# Patient Record
Sex: Female | Born: 1970 | Race: Black or African American | Hispanic: No | Marital: Single | Smoking: Former smoker
Health system: Southern US, Community
[De-identification: ages and names within clinical notes are randomized; demographics above are authoritative.]

## PROBLEM LIST (undated history)

## (undated) DIAGNOSIS — E119 Type 2 diabetes mellitus without complications: Secondary | ICD-10-CM

## (undated) DIAGNOSIS — K219 Gastro-esophageal reflux disease without esophagitis: Secondary | ICD-10-CM

## (undated) DIAGNOSIS — K859 Acute pancreatitis without necrosis or infection, unspecified: Secondary | ICD-10-CM

## (undated) DIAGNOSIS — E785 Hyperlipidemia, unspecified: Secondary | ICD-10-CM

## (undated) DIAGNOSIS — I10 Essential (primary) hypertension: Secondary | ICD-10-CM

## (undated) HISTORY — PX: TUBAL LIGATION: SHX77

## (undated) HISTORY — PX: CHOLECYSTECTOMY: SHX55

---

## 2004-10-03 ENCOUNTER — Other Ambulatory Visit: Payer: Self-pay

## 2004-10-03 ENCOUNTER — Emergency Department: Payer: Self-pay | Admitting: Emergency Medicine

## 2004-11-13 ENCOUNTER — Emergency Department: Payer: Self-pay | Admitting: Emergency Medicine

## 2005-04-24 ENCOUNTER — Emergency Department: Payer: Self-pay | Admitting: Emergency Medicine

## 2006-09-14 ENCOUNTER — Emergency Department: Payer: Self-pay | Admitting: Emergency Medicine

## 2006-11-14 ENCOUNTER — Emergency Department: Payer: Self-pay | Admitting: Emergency Medicine

## 2007-11-11 ENCOUNTER — Emergency Department: Payer: Self-pay | Admitting: Unknown Physician Specialty

## 2010-01-07 ENCOUNTER — Emergency Department: Payer: Self-pay | Admitting: Unknown Physician Specialty

## 2010-03-30 ENCOUNTER — Ambulatory Visit: Payer: Self-pay | Admitting: Internal Medicine

## 2010-04-01 ENCOUNTER — Ambulatory Visit: Payer: Self-pay | Admitting: Internal Medicine

## 2011-03-25 ENCOUNTER — Emergency Department: Payer: Self-pay | Admitting: Emergency Medicine

## 2012-07-21 ENCOUNTER — Ambulatory Visit: Payer: Self-pay | Admitting: Family Medicine

## 2012-09-13 ENCOUNTER — Ambulatory Visit: Payer: Self-pay | Admitting: Emergency Medicine

## 2012-09-13 LAB — RAPID STREP-A WITH REFLX: Micro Text Report: NEGATIVE

## 2012-09-15 ENCOUNTER — Ambulatory Visit: Payer: Self-pay | Admitting: Emergency Medicine

## 2012-09-18 LAB — BETA STREP CULTURE(ARMC)

## 2013-06-07 ENCOUNTER — Ambulatory Visit: Payer: Self-pay | Admitting: Family Medicine

## 2013-12-09 ENCOUNTER — Emergency Department: Payer: Self-pay | Admitting: Emergency Medicine

## 2015-07-13 ENCOUNTER — Emergency Department
Admission: EM | Admit: 2015-07-13 | Discharge: 2015-07-13 | Disposition: A | Discharge status: Home / Self Care | Payer: 59 | Attending: Emergency Medicine | Admitting: Emergency Medicine

## 2015-07-13 ENCOUNTER — Encounter: Payer: Self-pay | Admitting: Emergency Medicine

## 2015-07-13 DIAGNOSIS — R1084 Generalized abdominal pain: Secondary | ICD-10-CM | POA: Diagnosis present

## 2015-07-13 DIAGNOSIS — A0811 Acute gastroenteropathy due to Norwalk agent: Secondary | ICD-10-CM | POA: Diagnosis not present

## 2015-07-13 DIAGNOSIS — I10 Essential (primary) hypertension: Secondary | ICD-10-CM | POA: Diagnosis not present

## 2015-07-13 DIAGNOSIS — E119 Type 2 diabetes mellitus without complications: Secondary | ICD-10-CM | POA: Diagnosis not present

## 2015-07-13 HISTORY — DX: Essential (primary) hypertension: I10

## 2015-07-13 HISTORY — DX: Type 2 diabetes mellitus without complications: E11.9

## 2015-07-13 LAB — CBC
HCT: 33.7 % — ABNORMAL LOW (ref 35.0–47.0)
HEMOGLOBIN: 11.3 g/dL — AB (ref 12.0–16.0)
MCH: 25.9 pg — ABNORMAL LOW (ref 26.0–34.0)
MCHC: 33.6 g/dL (ref 32.0–36.0)
MCV: 76.9 fL — ABNORMAL LOW (ref 80.0–100.0)
Platelets: 290 10*3/uL (ref 150–440)
RBC: 4.38 MIL/uL (ref 3.80–5.20)
RDW: 14.9 % — ABNORMAL HIGH (ref 11.5–14.5)
WBC: 7.2 10*3/uL (ref 3.6–11.0)

## 2015-07-13 LAB — COMPREHENSIVE METABOLIC PANEL
ALK PHOS: 63 U/L (ref 38–126)
ALT: 12 U/L — ABNORMAL LOW (ref 14–54)
ANION GAP: 5 (ref 5–15)
AST: 14 U/L — ABNORMAL LOW (ref 15–41)
Albumin: 3.5 g/dL (ref 3.5–5.0)
BILIRUBIN TOTAL: 0.4 mg/dL (ref 0.3–1.2)
BUN: 12 mg/dL (ref 6–20)
CALCIUM: 8.7 mg/dL — AB (ref 8.9–10.3)
CO2: 26 mmol/L (ref 22–32)
Chloride: 102 mmol/L (ref 101–111)
Creatinine, Ser: 0.83 mg/dL (ref 0.44–1.00)
GFR calc non Af Amer: >60 mL/min (ref >60)
Glucose, Bld: 181 mg/dL — ABNORMAL HIGH (ref 65–99)
Potassium: 3.7 mmol/L (ref 3.5–5.1)
SODIUM: 133 mmol/L — AB (ref 135–145)
TOTAL PROTEIN: 8.2 g/dL — AB (ref 6.5–8.1)

## 2015-07-13 LAB — POCT PREGNANCY, URINE: PREG TEST UR: NEGATIVE

## 2015-07-13 LAB — URINALYSIS COMPLETE WITH MICROSCOPIC (ARMC ONLY)
Bacteria, UA: NONE SEEN
Bilirubin Urine: NEGATIVE
Glucose, UA: NEGATIVE mg/dL
Ketones, ur: NEGATIVE mg/dL
LEUKOCYTES UA: NEGATIVE
NITRITE: NEGATIVE
PROTEIN: NEGATIVE mg/dL
SPECIFIC GRAVITY, URINE: 1.012 (ref 1.005–1.030)
pH: 5 (ref 5.0–8.0)

## 2015-07-13 LAB — LIPASE, BLOOD: Lipase: 25 U/L (ref 11–51)

## 2015-07-13 MED ORDER — DICYCLOMINE HCL 20 MG PO TABS: 20.0000 mg | ORAL_TABLET | Freq: Three times a day (TID) | ORAL | Status: DC | PRN

## 2015-07-13 MED ORDER — ONDANSETRON HCL 4 MG PO TABS: 4.0000 mg | ORAL_TABLET | Freq: Every day | ORAL | Status: DC | PRN

## 2015-07-13 MED ORDER — ONDANSETRON 4 MG PO TBDP
4.0000 mg | ORAL_TABLET | Freq: Once | ORAL | Status: AC
  Administered 2015-07-13: 4 mg via ORAL
  Filled 2015-07-13: qty 1

## 2015-07-13 MED ORDER — FAMOTIDINE 20 MG PO TABS: 20.0000 mg | ORAL_TABLET | Freq: Two times a day (BID) | ORAL | Status: DC

## 2015-07-13 MED ORDER — OXYCODONE-ACETAMINOPHEN 5-325 MG PO TABS
2.0000 | ORAL_TABLET | Freq: Once | ORAL | Status: AC
  Administered 2015-07-13: 2 via ORAL
  Filled 2015-07-13: qty 2

## 2015-07-13 MED ORDER — LOPERAMIDE HCL 2 MG PO CAPS
4.0000 mg | ORAL_CAPSULE | Freq: Once | ORAL | Status: AC
  Administered 2015-07-13: 4 mg via ORAL
  Filled 2015-07-13: qty 2

## 2015-07-13 NOTE — Discharge Instructions (Signed)

## 2015-07-13 NOTE — ED Provider Notes (Signed)
[facility] Emergency Department Provider Note     Time seen: ----------------------------------------- 1:12 PM on 07/13/2015 -----------------------------------------    I have reviewed the triage vital signs and the nursing notes.   HISTORY  Chief Complaint Abdominal Pain    HPI Shelly Benjamin is a 45 y.o. female who presents to ER for generalized abdominal pain with nausea vomiting and diarrhea for the past 4 days. Patient states he last vomited Wednesday morning and has had diarrhea this morning. Patient states she's had some chronic abdominal pain that the etiology for was not discovered. She has had an endoscopy for this which was negative. Patient states she had been on Protonix. Aching at because she thought it was making her worse. Patient thinks it might be GERD related.   Past Medical History  Diagnosis Date  . Diabetes mellitus without complication (HCC)   . Hypertension     There are no active problems to display for this patient.   History reviewed. No pertinent past surgical history.  Allergies Morphine and related and Naproxen  Social History Social History  Substance Use Topics  . Smoking status: Never Smoker   . Smokeless tobacco: None  . Alcohol Use: No    Review of Systems Constitutional: Negative for fever. Eyes: Negative for visual changes. ENT: Negative for sore throat. Cardiovascular: Negative for chest pain. Respiratory: Negative for shortness of breath. Gastrointestinal: Positive for abdominal pain, vomiting and diarrhea Genitourinary: Negative for dysuria. Musculoskeletal: Negative for back pain. Skin: Negative for rash. Neurological: Negative for headaches, focal weakness or numbness.  10-point ROS otherwise negative.  ____________________________________________   PHYSICAL EXAM:  VITAL SIGNS: ED Triage Vitals  Enc Vitals Group     BP 07/13/15 1114 139/88 mmHg     Pulse Rate 07/13/15 1114 84    Resp 07/13/15 1114 20     Temp 07/13/15 1114 98.2 F (36.8 C)     Temp Source 07/13/15 1114 Oral     SpO2 07/13/15 1114 100 %     Weight 07/13/15 1114 291 lb (131.997 kg)     Height 07/13/15 1114 5\' 6"  (1.676 m)     Head Cir --      Peak Flow --      Pain Score 07/13/15 1111 8     Pain Loc --      Pain Edu? --      Excl. in GC? --    Constitutional: Alert and oriented. Well appearing and in no distress. Eyes: Conjunctivae are normal. PERRL. Normal extraocular movements. ENT   Head: Normocephalic and atraumatic.   Nose: No congestion/rhinnorhea.   Mouth/Throat: Mucous membranes are moist.   Neck: No stridor. Cardiovascular: Normal rate, regular rhythm. Normal and symmetric distal pulses are present in all extremities. No murmurs, rubs, or gallops. Respiratory: Normal respiratory effort without tachypnea nor retractions. Breath sounds are clear and equal bilaterally. No wheezes/rales/rhonchi. Gastrointestinal: Nonfocal tenderness, no rebound or guarding. Normal bowel sounds. Musculoskeletal: Nontender with normal range of motion in all extremities. No joint effusions.  No lower extremity tenderness nor edema. Neurologic:  Normal speech and language. No gross focal neurologic deficits are appreciated. Speech is normal. No gait instability. Skin:  Skin is warm, dry and intact. No rash noted. Psychiatric: Mood and affect are normal. Speech and behavior are normal. Patient exhibits appropriate insight and judgment. ____________________________________________  ED COURSE:  Pertinent labs & imaging results that were available during my care of the patient were reviewed by me and considered in  my medical decision making (see chart for details). Patient is no acute distress, will check abdominal labs, give antiemetics and antidiarrheal agents. ____________________________________________    LABS (pertinent positives/negatives)  Labs Reviewed  COMPREHENSIVE METABOLIC PANEL -  Abnormal; Notable for the following:    Sodium 133 (*)    Glucose, Bld 181 (*)    Calcium 8.7 (*)    Total Protein 8.2 (*)    AST 14 (*)    ALT 12 (*)    All other components within normal limits  CBC - Abnormal; Notable for the following:    Hemoglobin 11.3 (*)    HCT 33.7 (*)    MCV 76.9 (*)    MCH 25.9 (*)    RDW 14.9 (*)    All other components within normal limits  LIPASE, BLOOD  URINALYSIS COMPLETEWITH MICROSCOPIC ([facility] ONLY)  POC URINE PREG, ED  POCT PREGNANCY, URINE  ____________________________________________  FINAL ASSESSMENT AND PLAN  Norovirus  Plan: Patient with labs as dictated above. Patient looks well, labs are unremarkable with a benign exam. She'll be given antiemetics with Bentyl and is encouraged to restart an acids. She is stable for outpatient follow-up.   Emily Filbert, MD   Emily Filbert, MD 07/13/15 [phone]

## 2015-07-13 NOTE — ED Notes (Signed)
Developed body aches with abd pain and n/v/d for the past 4 days ..last time vomited was weds am  Last diarrhea times 4 this am

## 2015-07-13 NOTE — ED Notes (Signed)
UA Preg = Neg 

## 2015-09-29 ENCOUNTER — Ambulatory Visit
Admission: EM | Admit: 2015-09-29 | Discharge: 2015-09-29 | Disposition: A | Discharge status: Home / Self Care | Payer: 59 | Attending: Family Medicine | Admitting: Family Medicine

## 2015-09-29 DIAGNOSIS — A499 Bacterial infection, unspecified: Secondary | ICD-10-CM

## 2015-09-29 DIAGNOSIS — B3731 Acute candidiasis of vulva and vagina: Secondary | ICD-10-CM

## 2015-09-29 DIAGNOSIS — B3749 Other urogenital candidiasis: Secondary | ICD-10-CM | POA: Diagnosis not present

## 2015-09-29 DIAGNOSIS — N760 Acute vaginitis: Secondary | ICD-10-CM | POA: Diagnosis not present

## 2015-09-29 DIAGNOSIS — B373 Candidiasis of vulva and vagina: Secondary | ICD-10-CM | POA: Diagnosis not present

## 2015-09-29 DIAGNOSIS — B9689 Other specified bacterial agents as the cause of diseases classified elsewhere: Secondary | ICD-10-CM

## 2015-09-29 HISTORY — DX: Gastro-esophageal reflux disease without esophagitis: K21.9

## 2015-09-29 LAB — URINALYSIS COMPLETE WITH MICROSCOPIC (ARMC ONLY)
Bilirubin Urine: NEGATIVE
GLUCOSE, UA: NEGATIVE mg/dL
KETONES UR: NEGATIVE mg/dL
NITRITE: NEGATIVE
PROTEIN: NEGATIVE mg/dL
SPECIFIC GRAVITY, URINE: 1.015 (ref 1.005–1.030)
pH: 6.5 (ref 5.0–8.0)

## 2015-09-29 LAB — GLUCOSE, CAPILLARY: GLUCOSE-CAPILLARY: 160 mg/dL — AB (ref 65–99)

## 2015-09-29 LAB — WET PREP, GENITAL
SPERM: NONE SEEN
TRICH WET PREP: NONE SEEN

## 2015-09-29 LAB — CHLAMYDIA/NGC RT PCR (ARMC ONLY)
Chlamydia Tr: NOT DETECTED
N gonorrhoeae: NOT DETECTED

## 2015-09-29 MED ORDER — METRONIDAZOLE 500 MG PO TABS: 500.0000 mg | ORAL_TABLET | Freq: Two times a day (BID) | ORAL | Status: DC

## 2015-09-29 MED ORDER — TERCONAZOLE 80 MG VA SUPP: 80.0000 mg | Freq: Every day | VAGINAL | Status: DC

## 2015-09-29 MED ORDER — FLUCONAZOLE 150 MG PO TABS: 150.0000 mg | ORAL_TABLET | Freq: Once | ORAL | Status: DC

## 2015-09-29 NOTE — Discharge Instructions (Signed)
Bacterial Vaginosis °Bacterial vaginosis is an infection of the vagina. It happens when too many germs (bacteria) grow in the vagina. Having this infection puts you at risk for getting other infections from sex. Treating this infection can help lower your risk for other infections, such as:  °· Chlamydia. °· Gonorrhea. °· HIV. °· Herpes. °HOME CARE °· Take your medicine as told by your doctor. °· Finish your medicine even if you start to feel better. °· Tell your sex partner that you have an infection. They should see their doctor for treatment. °· During treatment: °· Avoid sex or use condoms correctly. °· Do not douche. °· Do not drink alcohol unless your doctor tells you it is ok. °· Do not breastfeed unless your doctor tells you it is ok. °GET HELP IF: °· You are not getting better after 3 days of treatment. °· You have more grey fluid (discharge) coming from your vagina than before. °· You have more pain than before. °· You have a fever. °MAKE SURE YOU:  °· Understand these instructions. °· Will watch your condition. °· Will get help right away if you are not doing well or get worse. °  °This information is not intended to replace advice given to you by your health care provider. Make sure you discuss any questions you have with your health care provider. °  °Document Released: 01/22/2008 Document Revised: 05/05/2014 Document Reviewed: 11/24/2012 °Elsevier Interactive Patient Education ©2016 [employer] ° °Monilial Vaginitis °Vaginitis in a soreness, swelling and redness (inflammation) of the vagina and vulva. Monilial vaginitis is not a sexually transmitted infection. °CAUSES  °Yeast vaginitis is caused by yeast (candida) that is normally found in your vagina. With a yeast infection, the candida has overgrown in number to a point that upsets the chemical balance. °SYMPTOMS  °· White, thick vaginal discharge. °· Swelling, itching, redness and irritation of the vagina and possibly the lips of the vagina  (vulva). °· Burning or painful urination. °· Painful intercourse. °DIAGNOSIS  °Things that may contribute to monilial vaginitis are: °· Postmenopausal and virginal states. °· Pregnancy. °· Infections. °· Being tired, sick or stressed, especially if you had monilial vaginitis in the past. °· Diabetes. Good control will help lower the chance. °· Birth control pills. °· Tight fitting garments. °· Using bubble bath, feminine sprays, douches or deodorant tampons. °· Taking certain medications that kill germs (antibiotics). °· Sporadic recurrence can occur if you become ill. °TREATMENT  °Your caregiver will give you medication. °· There are several kinds of anti monilial vaginal creams and suppositories specific for monilial vaginitis. For recurrent yeast infections, use a suppository or cream in the vagina 2 times a week, or as directed. °· Anti-monilial or steroid cream for the itching or irritation of the vulva may also be used. Get your caregiver's permission. °· Painting the vagina with methylene blue solution may help if the monilial cream does not work. °· Eating yogurt may help prevent monilial vaginitis. °HOME CARE INSTRUCTIONS  °· Finish all medication as prescribed. °· Do not have sex until treatment is completed or after your caregiver tells you it is okay. °· Take warm sitz baths. °· Do not douche. °· Do not use tampons, especially scented ones. °· Wear cotton underwear. °· Avoid tight pants and panty hose. °· Tell your sexual partner that you have a yeast infection. They should go to their caregiver if they have symptoms such as mild rash or itching. °· Your sexual partner should be treated as well   if your infection is difficult to eliminate. °· Practice safer sex. Use condoms. °· Some vaginal medications cause latex condoms to fail. Vaginal medications that harm condoms are: °¨ Cleocin cream. °¨ Butoconazole (Femstat®). °¨ Terconazole (Terazol®) vaginal suppository. °¨ Miconazole (Monistat®) (may be  purchased over the counter). °SEEK MEDICAL CARE IF:  °· You have a temperature by mouth above 102° F (38.9° C). °· The infection is getting worse after 2 days of treatment. °· The infection is not getting better after 3 days of treatment. °· You develop blisters in or around your vagina. °· You develop vaginal bleeding, and it is not your menstrual period. °· You have pain when you urinate. °· You develop intestinal problems. °· You have pain with sexual intercourse. °  °This information is not intended to replace advice given to you by your health care provider. Make sure you discuss any questions you have with your health care provider. °  °Document Released: 01/22/2005 Document Revised: 07/07/2011 Document Reviewed: 10/16/2014 °Elsevier Interactive Patient Education ©2016 [employer] ° °Vaginitis °Vaginitis is an inflammation of the vagina. It can happen when the normal bacteria and yeast in the vagina grow too much. There are different types. Treatment will depend on the type you have. °HOME CARE °· Take all medicines as told by your doctor. °· Keep your vagina area clean and dry. Avoid soap. Rinse the area with water. °· Avoid washing and cleaning out the vagina (douching). °· Do not use tampons or have sex (intercourse) until your treatment is done. °· Wipe from front to back after going to the restroom. °· Wear cotton underwear. °· Avoid wearing underwear while you sleep until your vaginitis is gone. °· Avoid tight pants. Avoid underwear or nylons without a cotton panel. °· Take off wet clothing (such as a bathing suit) as soon as you can. °· Use mild, unscented products. Avoid fabric softeners and scented: °¨ Feminine sprays. °¨ Laundry detergents. °¨ Tampons. °¨ Soaps or bubble baths. °· Practice safe sex and use condoms. °GET HELP RIGHT AWAY IF:  °· You have belly (abdominal) pain. °· You have a fever or lasting symptoms for more than 2-3 days. °· You have a fever and your symptoms suddenly get  worse. °MAKE SURE YOU:  °· Understand these instructions. °· Will watch this condition. °· Will get help right away if you are not doing well or get worse. °  °This information is not intended to replace advice given to you by your health care provider. Make sure you discuss any questions you have with your health care provider. °  °Document Released: 07/11/2008 Document Revised: 01/07/2012 Document Reviewed: 09/25/2011 °Elsevier Interactive Patient Education ©2016 [employer]vier [employer]. ° °

## 2015-09-29 NOTE — ED Provider Notes (Signed)
CSN: [id number]     Arrival date & time 09/29/15  4098 History   First MD Initiated Contact with Patient 09/29/15 [phone]    Nurses notes were reviewed. Chief Complaint  Patient presents with  . Vaginal Discharge    Pt reports protected sex with a new partner last Sat. (condom). Has hx of genital herpes. Denies current outbreak. On Tuesday started with vaginal itching then "clumpy grey drainage". Burning with urination started today. Pain 3/10   Patient with a small complex history. She reports a history of genital herpes about 8 or 9 years ago. She's not on suppressive therapy and has not had another outbreak since the first episode. She reports having a new partner she was at the beach this weekend and had sexual relations with a new partner but states that the condom was used at all times. During this time which was at the beach she also became dehydrated while getting IV fluids and she's had trouble complications were for diabetes she's increased her insulin NovoLog continues to 12 units twice a day but has also no significant decrease in appetite as well but she feels overall better. She reports on Tuesday she started having some vaginal irritation and that has progressively gotten worse the discharge greatest discharge and burning with urination today and some discomfort that she opted not to going to work today. She's not on suppressive therapy for her herpes infection.   She has a history of diabetes hypertension and GERD she's had a cholecystectomy and a tubal ligation before in the past. She unfortunately does smoke. She is allergic to morphine and Naprosyn  Diabetes and hypertension was the family but of course nothing is pertinent to today's visit.    (Consider location/radiation/quality/duration/timing/severity/associated sxs/prior Treatment) Patient is a 44 y.o. female presenting with vaginal discharge. The history is provided by the patient. No language interpreter was used.  Vaginal  Discharge Quality:  Wallace Cullens Severity:  Moderate Duration:  5 days Timing:  Constant Progression:  Worsening Chronicity:  New Context: during urination and spontaneously   Context: not recent antibiotic use   Relieved by:  Nothing Ineffective treatments:  None tried Associated symptoms: dysuria, urinary frequency and vaginal itching   Risk factors: new sexual partner   Risk factors: no unprotected sex     Past Medical History  Diagnosis Date  . Diabetes mellitus without complication (HCC)   . Hypertension   . GERD (gastroesophageal reflux disease)    Past Surgical History  Procedure Laterality Date  . Cholecystectomy    . Tubal ligation     History reviewed. No pertinent family history. Social History  Substance Use Topics  . Smoking status: Current Every Day Smoker -- 0.50 packs/day    Types: Cigarettes  . Smokeless tobacco: None  . Alcohol Use: No   OB History    No data available     Review of Systems  Genitourinary: Positive for dysuria and vaginal discharge.  All other systems reviewed and are negative.   Allergies  Morphine and related and Naproxen  Home Medications   Prior to Admission medications   Medication Sig Start Date End Date Taking? Authorizing Provider  amLODipine (NORVASC) 10 MG tablet Take 10 mg by mouth daily.   Yes Historical Provider, MD  famotidine (PEPCID) 20 MG tablet Take 1 tablet (20 mg total) by mouth 2 (two) times daily. 07/13/15  Yes Emily Filbert, MD  hydrochlorothiazide (HYDRODIURIL) 25 MG tablet Take 25 mg by mouth daily.   Yes  Historical Provider, MD  insulin NPH Human (HUMULIN N,NOVOLIN N) 100 UNIT/ML injection Inject 12 Units into the skin 2 (two) times daily before a meal.   Yes Historical Provider, MD  lisinopril (PRINIVIL,ZESTRIL) 10 MG tablet Take 10 mg by mouth daily.   Yes Historical Provider, MD  ondansetron (ZOFRAN) 4 MG tablet Take 1 tablet (4 mg total) by mouth daily as needed for nausea or vomiting. 07/13/15  Yes  Emily Filbert, MD  dicyclomine (BENTYL) 20 MG tablet Take 1 tablet (20 mg total) by mouth 3 (three) times daily as needed for spasms. 07/13/15 07/12/16  Emily Filbert, MD   Meds Ordered and Administered this Visit  Medications - No data to display  BP 138/81 mmHg  Pulse 84  Temp(Src) 98 F (36.7 C) (Oral)  Resp 20  Ht 5\' 8"  (1.727 m)  Wt 288 lb 5 oz (130.778 kg)  BMI 43.85 kg/m2  SpO2 100%  LMP 09/13/2015 No data found.   Physical Exam  Constitutional: She is oriented to person, place, and time. She appears well-developed and well-nourished.  HENT:  Head: Normocephalic and atraumatic.  Eyes: Pupils are equal, round, and reactive to light.  Genitourinary: Uterus normal. Rectal exam shows no external hemorrhoid, no internal hemorrhoid, no fissure and no tenderness.    There is lesion on the right labia. Cervix exhibits discharge. Right adnexum displays no tenderness and no fullness. Left adnexum displays no tenderness and no fullness. There is erythema in the vagina. Vaginal discharge found.  Musculoskeletal: Normal range of motion. She exhibits no edema.  Neurological: She is alert and oriented to person, place, and time.  Skin: Skin is warm and dry.  Psychiatric: She has a normal mood and affect.  Vitals reviewed.   ED Course  Procedures (including critical care time)  Labs Review Labs Reviewed  WET PREP, GENITAL - Abnormal; Notable for the following:    Yeast Wet Prep HPF POC PRESENT (*)    Clue Cells Wet Prep HPF POC PRESENT (*)    WBC, Wet Prep HPF POC MODERATE (*)    All other components within normal limits  URINALYSIS COMPLETEWITH MICROSCOPIC ([facility] ONLY) - Abnormal; Notable for the following:    Color, Urine STRAW (*)    APPearance HAZY (*)    Hgb urine dipstick 2+ (*)    Leukocytes, UA 2+ (*)    Bacteria, UA FEW (*)    Squamous Epithelial / LPF 6-30 (*)    All other components within normal limits  CHLAMYDIA/NGC RT PCR ([facility] ONLY)  HSV  CULTURE AND TYPING  URINE CULTURE  RPR  HIV ANTIBODY (ROUTINE TESTING)  CBG MONITORING, ED    Imaging Review No results found.   Visual Acuity Review  Right Eye Distance:   Left Eye Distance:   Bilateral Distance:    Right Eye Near:   Left Eye Near:    Bilateral Near:        Results for orders placed or performed during the hospital encounter of 09/29/15  Wet prep, genital  Result Value Ref Range   Yeast Wet Prep HPF POC PRESENT (A) NONE SEEN   Trich, Wet Prep NONE SEEN NONE SEEN   Clue Cells Wet Prep HPF POC PRESENT (A) NONE SEEN   WBC, Wet Prep HPF POC MODERATE (A) NONE SEEN   Sperm NONE SEEN   Urinalysis complete, with microscopic  Result Value Ref Range   Color, Urine STRAW (A) YELLOW   APPearance HAZY (A) CLEAR  Glucose, UA NEGATIVE NEGATIVE mg/dL   Bilirubin Urine NEGATIVE NEGATIVE   Ketones, ur NEGATIVE NEGATIVE mg/dL   Specific Gravity, Urine 1.015 1.005 - 1.030   Hgb urine dipstick 2+ (A) NEGATIVE   pH 6.5 5.0 - 8.0   Protein, ur NEGATIVE NEGATIVE mg/dL   Nitrite NEGATIVE NEGATIVE   Leukocytes, UA 2+ (A) NEGATIVE   RBC / HPF 0-5 0 - 5 RBC/hpf   WBC, UA 6-30 0 - 5 WBC/hpf   Bacteria, UA FEW (A) NONE SEEN   Squamous Epithelial / LPF 6-30 (A) NONE SEEN   Budding Yeast PRESENT         MDM   1. Bacterial vaginitis   2. Yeast vaginitis   3. Yeast UTI    Patient has bacterial vaginosis and she has yeast in the urine and the vaginal swab. Would recommend this point time Terazol vaginal suppository 80 mg one per vagina daily for 3 nights Diflucan by mouth and Flagyl 500 mg twice a day. RPR is pending HIV is pending and herpes culture is also pending. Will obtain a point-of-care blood sugar just to make sure the diabetes Is not getting out of control especially with her having marked yeast infection. Should be noted urinalysis was done culture the urine was ordered but is no signs of ketones in the urine this time. Which were premarked rule out  ketoacidosis  Note: This dictation was prepared with Dragon dictation along with smaller phrase technology. Any transcriptional errors that result from this process are unintentional.  Hassan RowanEugene Carmelo Reidel, MD 09/29/15 [phone]

## 2015-10-01 LAB — URINE CULTURE: Special Requests: NORMAL

## 2015-10-02 LAB — RPR: RPR: NONREACTIVE

## 2015-10-02 LAB — HIV ANTIBODY (ROUTINE TESTING W REFLEX): HIV Screen 4th Generation wRfx: NONREACTIVE

## 2015-10-03 LAB — HSV CULTURE AND TYPING

## 2015-10-09 ENCOUNTER — Encounter: Payer: Self-pay | Admitting: Emergency Medicine

## 2015-10-09 ENCOUNTER — Emergency Department
Admission: EM | Admit: 2015-10-09 | Discharge: 2015-10-09 | Disposition: A | Discharge status: Home / Self Care | Payer: 59 | Attending: Emergency Medicine | Admitting: Emergency Medicine

## 2015-10-09 ENCOUNTER — Emergency Department: Payer: 59

## 2015-10-09 DIAGNOSIS — R197 Diarrhea, unspecified: Secondary | ICD-10-CM | POA: Diagnosis not present

## 2015-10-09 DIAGNOSIS — Z79899 Other long term (current) drug therapy: Secondary | ICD-10-CM | POA: Diagnosis not present

## 2015-10-09 DIAGNOSIS — R112 Nausea with vomiting, unspecified: Secondary | ICD-10-CM | POA: Insufficient documentation

## 2015-10-09 DIAGNOSIS — R599 Enlarged lymph nodes, unspecified: Secondary | ICD-10-CM | POA: Insufficient documentation

## 2015-10-09 DIAGNOSIS — I10 Essential (primary) hypertension: Secondary | ICD-10-CM | POA: Diagnosis not present

## 2015-10-09 DIAGNOSIS — E119 Type 2 diabetes mellitus without complications: Secondary | ICD-10-CM | POA: Diagnosis not present

## 2015-10-09 DIAGNOSIS — R1013 Epigastric pain: Secondary | ICD-10-CM

## 2015-10-09 DIAGNOSIS — Z794 Long term (current) use of insulin: Secondary | ICD-10-CM | POA: Insufficient documentation

## 2015-10-09 DIAGNOSIS — F17210 Nicotine dependence, cigarettes, uncomplicated: Secondary | ICD-10-CM | POA: Diagnosis not present

## 2015-10-09 LAB — COMPREHENSIVE METABOLIC PANEL
ALK PHOS: 60 U/L (ref 38–126)
ALT: 12 U/L — AB (ref 14–54)
ANION GAP: 9 (ref 5–15)
AST: 12 U/L — ABNORMAL LOW (ref 15–41)
Albumin: 3.6 g/dL (ref 3.5–5.0)
BUN: 9 mg/dL (ref 6–20)
CALCIUM: 9.3 mg/dL (ref 8.9–10.3)
CO2: 23 mmol/L (ref 22–32)
CREATININE: 0.94 mg/dL (ref 0.44–1.00)
Chloride: 103 mmol/L (ref 101–111)
Glucose, Bld: 142 mg/dL — ABNORMAL HIGH (ref 65–99)
Potassium: 3.5 mmol/L (ref 3.5–5.1)
SODIUM: 135 mmol/L (ref 135–145)
Total Bilirubin: 0.3 mg/dL (ref 0.3–1.2)
Total Protein: 8 g/dL (ref 6.5–8.1)

## 2015-10-09 LAB — URINALYSIS COMPLETE WITH MICROSCOPIC (ARMC ONLY)
BILIRUBIN URINE: NEGATIVE
Glucose, UA: NEGATIVE mg/dL
HGB URINE DIPSTICK: NEGATIVE
Ketones, ur: NEGATIVE mg/dL
LEUKOCYTES UA: NEGATIVE
Nitrite: NEGATIVE
PROTEIN: NEGATIVE mg/dL
Specific Gravity, Urine: 1.01 (ref 1.005–1.030)
pH: 8 (ref 5.0–8.0)

## 2015-10-09 LAB — CBC
HEMATOCRIT: 34.4 % — AB (ref 35.0–47.0)
Hemoglobin: 11.5 g/dL — ABNORMAL LOW (ref 12.0–16.0)
MCH: 25.8 pg — AB (ref 26.0–34.0)
MCHC: 33.5 g/dL (ref 32.0–36.0)
MCV: 77 fL — AB (ref 80.0–100.0)
Platelets: 255 10*3/uL (ref 150–440)
RBC: 4.47 MIL/uL (ref 3.80–5.20)
RDW: 14.9 % — AB (ref 11.5–14.5)
WBC: 11.3 10*3/uL — AB (ref 3.6–11.0)

## 2015-10-09 LAB — TROPONIN I

## 2015-10-09 LAB — LIPASE, BLOOD: LIPASE: 48 U/L (ref 11–51)

## 2015-10-09 MED ORDER — DICYCLOMINE HCL 20 MG PO TABS: 20.0000 mg | ORAL_TABLET | Freq: Three times a day (TID) | ORAL | Status: DC | PRN

## 2015-10-09 MED ORDER — FENTANYL CITRATE (PF) 100 MCG/2ML IJ SOLN
50.0000 ug | Freq: Once | INTRAMUSCULAR | Status: AC
  Administered 2015-10-09: 50 ug via INTRAVENOUS

## 2015-10-09 MED ORDER — FENTANYL CITRATE (PF) 100 MCG/2ML IJ SOLN
INTRAMUSCULAR | Status: AC
  Administered 2015-10-09: 50 ug via INTRAVENOUS
  Filled 2015-10-09: qty 2

## 2015-10-09 MED ORDER — ONDANSETRON HCL 4 MG/2ML IJ SOLN
INTRAMUSCULAR | Status: AC
  Administered 2015-10-09: 4 mg via INTRAVENOUS
  Filled 2015-10-09: qty 2

## 2015-10-09 MED ORDER — ONDANSETRON HCL 4 MG PO TABS: 4.0000 mg | ORAL_TABLET | Freq: Every day | ORAL | Status: DC | PRN

## 2015-10-09 MED ORDER — ONDANSETRON HCL 4 MG/2ML IJ SOLN
4.0000 mg | Freq: Once | INTRAMUSCULAR | Status: AC
  Administered 2015-10-09: 4 mg via INTRAVENOUS

## 2015-10-09 NOTE — ED Notes (Addendum)
Pt to triage via w/c with no distress noted, brought in by EMS; generalized abd pain and swelling to lymph nodes and nausea since Sunday; [facility] yesterday with no dx, negative labs & CT scan

## 2015-10-09 NOTE — ED Notes (Signed)
PO challenge initiated with saltine crackers and ginger ale

## 2015-10-09 NOTE — ED Notes (Signed)
Urine pregnancy test: Negative

## 2015-10-09 NOTE — Discharge Instructions (Signed)
Abdominal Pain, Adult Many things can cause abdominal pain. Usually, abdominal pain is not caused by a disease and will improve without treatment. It can often be observed and treated at home. Your health care provider will do a physical exam and possibly order blood tests and X-rays to help determine the seriousness of your pain. However, in many cases, more time must pass before a clear cause of the pain can be found. Before that point, your health care provider may not know if you need more testing or further treatment. HOME CARE INSTRUCTIONS Monitor your abdominal pain for any changes. The following actions may help to alleviate any discomfort you are experiencing:  Only take over-the-counter or prescription medicines as directed by your health care provider.  Do not take laxatives unless directed to do so by your health care provider.  Try a clear liquid diet (broth, tea, or water) as directed by your health care provider. Slowly move to a bland diet as tolerated. SEEK MEDICAL CARE IF:  You have unexplained abdominal pain.  You have abdominal pain associated with nausea or diarrhea.  You have pain when you urinate or have a bowel movement.  You experience abdominal pain that wakes you in the night.  You have abdominal pain that is worsened or improved by eating food.  You have abdominal pain that is worsened with eating fatty foods.  You have a fever. SEEK IMMEDIATE MEDICAL CARE IF:  Your pain does not go away within 2 hours.  You keep throwing up (vomiting).  Your pain is felt only in portions of the abdomen, such as the right side or the left lower portion of the abdomen.  You pass bloody or black tarry stools. MAKE SURE YOU:  Understand these instructions.  Will watch your condition.  Will get help right away if you are not doing well or get worse.   This information is not intended to replace advice given to you by your health care provider. Make sure you discuss  any questions you have with your health care provider.   Document Released: 01/22/2005 Document Revised: 01/03/2015 Document Reviewed: 12/22/2012 Elsevier Interactive Patient Education 2016 [place].  Diarrhea Diarrhea is frequent loose and watery bowel movements. It can cause you to feel weak and dehydrated. Dehydration can cause you to become tired and thirsty, have a dry mouth, and have decreased urination that often is dark yellow. Diarrhea is a sign of another problem, most often an infection that will not last long. In most cases, diarrhea typically lasts 2-3 days. However, it can last longer if it is a sign of something more serious. It is important to treat your diarrhea as directed by your caregiver to lessen or prevent future episodes of diarrhea. CAUSES  Some common causes include:  Gastrointestinal infections caused by viruses, bacteria, or parasites.  Food poisoning or food allergies.  Certain medicines, such as antibiotics, chemotherapy, and laxatives.  Artificial sweeteners and fructose.  Digestive disorders. HOME CARE INSTRUCTIONS  Ensure adequate fluid intake (hydration): Have 1 cup (8 oz) of fluid for each diarrhea episode. Avoid fluids that contain simple sugars or sports drinks, fruit juices, whole milk products, and sodas. Your urine should be clear or pale yellow if you are drinking enough fluids. Hydrate with an oral rehydration solution that you can purchase at pharmacies, retail stores, and online. You can prepare an oral rehydration solution at home by mixing the following ingredients together:   - tsp table salt.   tsp baking soda.  tsp salt substitute containing potassium chloride.  1  tablespoons sugar.  1 L (34 oz) of water.  Certain foods and beverages may increase the speed at which food moves through the gastrointestinal (GI) tract. These foods and beverages should be avoided and include:  Caffeinated and alcoholic beverages.  High-fiber  foods, such as raw fruits and vegetables, nuts, seeds, and whole grain breads and cereals.  Foods and beverages sweetened with sugar alcohols, such as xylitol, sorbitol, and mannitol.  Some foods may be well tolerated and may help thicken stool including:  Starchy foods, such as rice, toast, pasta, low-sugar cereal, oatmeal, grits, baked potatoes, crackers, and bagels.  Bananas.  Applesauce.  Add probiotic-rich foods to help increase healthy bacteria in the GI tract, such as yogurt and fermented milk products.  Wash your hands well after each diarrhea episode.  Only take over-the-counter or prescription medicines as directed by your caregiver.  Take a warm bath to relieve any burning or pain from frequent diarrhea episodes. SEEK IMMEDIATE MEDICAL CARE IF:   You are unable to keep fluids down.  You have persistent vomiting.  You have blood in your stool, or your stools are black and tarry.  You do not urinate in 6-8 hours, or there is only a small amount of very dark urine.  You have abdominal pain that increases or localizes.  You have weakness, dizziness, confusion, or light-headedness.  You have a severe headache.  Your diarrhea gets worse or does not get better.  You have a fever or persistent symptoms for more than 2-3 days.  You have a fever and your symptoms suddenly get worse. MAKE SURE YOU:   Understand these instructions.  Will watch your condition.  Will get help right away if you are not doing well or get worse.   This information is not intended to replace advice given to you by your health care provider. Make sure you discuss any questions you have with your health care provider.   Document Released: 04/04/2002 Document Revised: 05/05/2014 Document Reviewed: 12/21/2011 Elsevier Interactive Patient Education 2016 [employer]  Nausea and Vomiting Nausea means you feel sick to your stomach. Throwing up (vomiting) is a reflex where stomach contents  come out of your mouth. HOME CARE   Take medicine as told by your doctor.  Do not force yourself to eat. However, you do need to drink fluids.  If you feel like eating, eat a normal diet as told by your doctor.  Eat rice, wheat, potatoes, bread, lean meats, yogurt, fruits, and vegetables.  Avoid high-fat foods.  Drink enough fluids to keep your pee (urine) clear or pale yellow.  Ask your doctor how to replace body fluid losses (rehydrate). Signs of body fluid loss (dehydration) include:  Feeling very thirsty.  Dry lips and mouth.  Feeling dizzy.  Dark pee.  Peeing less than normal.  Feeling confused.  Fast breathing or heart rate. GET HELP RIGHT AWAY IF:   You have blood in your throw up.  You have black or bloody poop (stool).  You have a bad headache or stiff neck.  You feel confused.  You have bad belly (abdominal) pain.  You have chest pain or trouble breathing.  You do not pee at least once every 8 hours.  You have cold, clammy skin.  You keep throwing up after 24 to 48 hours.  You have a fever. MAKE SURE YOU:   Understand these instructions.  Will watch your condition.  Will get help  right away if you are not doing well or get worse.   This information is not intended to replace advice given to you by your health care provider. Make sure you discuss any questions you have with your health care provider.   Document Released: 10/01/2007 Document Revised: 07/07/2011 Document Reviewed: 09/13/2010 Elsevier Interactive Patient Education 2016 [employer]  Lymphadenopathy Lymphadenopathy refers to swollen or enlarged lymph glands, also called lymph nodes. Lymph glands are part of your body's defense (immune) system, which protects the body from infections, germs, and diseases. Lymph glands are found in many locations in your body, including the neck, underarm, and groin.  Many things can cause lymph glands to become enlarged. When your immune system  responds to germs, such as viruses or bacteria, infection-fighting cells and fluid build up. This causes the glands to grow in size. Usually, this is not something to worry about. The swelling and any soreness often go away without treatment. However, swollen lymph glands can also be caused by a number of diseases. Your health care provider may do various tests to help determine the cause. If the cause of your swollen lymph glands cannot be found, it is important to monitor your condition to make sure the swelling goes away. HOME CARE INSTRUCTIONS Watch your condition for any changes. The following actions may help to lessen any discomfort you are feeling:  Get plenty of rest.  Take medicines only as directed by your health care provider. Your health care provider may recommend over-the-counter medicines for pain.  Apply moist heat compresses to the site of swollen lymph nodes as directed by your health care provider. This can help reduce any pain.  Check your lymph nodes daily for any changes.  Keep all follow-up visits as directed by your health care provider. This is important. SEEK MEDICAL CARE IF:  Your lymph nodes are still swollen after 2 weeks.  Your swelling increases or spreads to other areas.  Your lymph nodes are hard, seem fixed to the skin, or are growing rapidly.  Your skin over the lymph nodes is red and inflamed.  You have a fever.  You have chills.  You have fatigue.  You develop a sore throat.  You have abdominal pain.  You have weight loss.  You have night sweats. SEEK IMMEDIATE MEDICAL CARE IF:  You notice fluid leaking from the area of the enlarged lymph node.  You have severe pain in any area of your body.  You have chest pain.  You have shortness of breath.   This information is not intended to replace advice given to you by your health care provider. Make sure you discuss any questions you have with your health care provider.   Document  Released: 01/22/2008 Document Revised: 05/05/2014 Document Reviewed: 11/17/2013 Elsevier Interactive Patient Education [employer]

## 2015-10-09 NOTE — ED Notes (Signed)
Pt reports she has had N/V/D since Sunday accompanied by upper abdominal pain and has not been able to keep any liquids down.

## 2015-10-09 NOTE — ED Provider Notes (Signed)
[facility] Emergency Department Provider Note   ____________________________________________  Time seen: Approximately 820 PM  I have reviewed the triage vital signs and the nursing notes.   HISTORY  Chief Complaint Abdominal Pain   HPI Shelly Benjamin is a 45 y.o. female with a history of GERD, diabetes and hypertension is present. Emergency department today with upper abdominal pain over the last 3 days. She says that her issues began this past Sunday evening when she could not tolerate anything to eat or drink. She says she has vomited multiple times since then but without any bloody vomitus. Also with multiple episodes of diarrhea without any bloody diarrhea. Denies any recent antibiotics. Says that the abdominal pain now is epigastric sharp and radiating up into her chest. Denies any alcohol use. Does have a history of pancreatitis. Denies any burning with urination. Does not report any vaginal discharge or bleeding. [facility] and had a CAT scan without any acute pathology. She is also complaining of a swollen "not" to the right side of her neck. Has also had clear rhinorrhea since Sunday. No known sick contacts. Says that the abdominal pain as a 10 out of 10. Denies being given any scripts upon [facility].Has been to the emergency department multiple times in the past for similar issues and has had multiple reassuring workups.   Past Medical History  Diagnosis Date  . Diabetes mellitus without complication (HCC)   . Hypertension   . GERD (gastroesophageal reflux disease)     There are no active problems to display for this patient.   Past Surgical History  Procedure Laterality Date  . Cholecystectomy    . Tubal ligation      Current Outpatient Rx  Name  Route  Sig  Dispense  Refill  . amLODipine (NORVASC) 10 MG tablet   Oral   Take 10 mg by mouth daily.         Marland Kitchen. dicyclomine (BENTYL) 20 MG  tablet   Oral   Take 1 tablet (20 mg total) by mouth 3 (three) times daily as needed for spasms.   30 tablet   0   . famotidine (PEPCID) 20 MG tablet   Oral   Take 1 tablet (20 mg total) by mouth 2 (two) times daily.   60 tablet   1   . fluconazole (DIFLUCAN) 150 MG tablet   Oral   Take 1 tablet (150 mg total) by mouth once.   2 tablet   1   . hydrochlorothiazide (HYDRODIURIL) 25 MG tablet   Oral   Take 25 mg by mouth daily.         . insulin NPH Human (HUMULIN N,NOVOLIN N) 100 UNIT/ML injection   Subcutaneous   Inject 12 Units into the skin 2 (two) times daily before a meal.         . lisinopril (PRINIVIL,ZESTRIL) 10 MG tablet   Oral   Take 10 mg by mouth daily.         . metroNIDAZOLE (FLAGYL) 500 MG tablet   Oral   Take 1 tablet (500 mg total) by mouth 2 (two) times daily.   14 tablet   0   . ondansetron (ZOFRAN) 4 MG tablet   Oral   Take 1 tablet (4 mg total) by mouth daily as needed for nausea or vomiting.   20 tablet   1   . terconazole (TERAZOL 3) 80 MG vaginal suppository  Vaginal   Place 1 suppository (80 mg total) vaginally at bedtime.   3 suppository   0     Allergies Morphine and related and Naproxen  No family history on file.  Social History Social History  Substance Use Topics  . Smoking status: Current Every Day Smoker -- 0.50 packs/day    Types: Cigarettes  . Smokeless tobacco: None  . Alcohol Use: No    Review of Systems Constitutional: No fever/chills Eyes: No visual changes. ENT: No sore throat. Cardiovascular:As above Respiratory: Denies shortness of breath. Gastrointestinal:  No constipation. Genitourinary: Negative for dysuria. Musculoskeletal: Negative for back pain. Skin: Negative for rash. Neurological: Negative for headaches, focal weakness or numbness.  10-point ROS otherwise negative.  ____________________________________________   PHYSICAL EXAM:  VITAL SIGNS: ED Triage Vitals  Enc Vitals Group      BP 10/09/15 1930 118/71 mmHg     Pulse Rate 10/09/15 1930 86     Resp 10/09/15 1930 20     Temp 10/09/15 1930 97.8 F (36.6 C)     Temp Source 10/09/15 1930 Oral     SpO2 10/09/15 1930 100 %     Weight 10/09/15 1930 280 lb (127.007 kg)     Height 10/09/15 1930 5\' 6"  (1.676 m)     Head Cir --      Peak Flow --      Pain Score 10/09/15 1929 10     Pain Loc --      Pain Edu? --      Excl. in GC? --     Constitutional: Alert and oriented. Well appearing and in no acute distress. Eyes: Conjunctivae are normal. PERRL. EOMI. Head: Atraumatic. Nose: No congestion/rhinnorhea. Mouth/Throat: Mucous membranes are moist.  Oropharynx non-erythematous. Neck: No stridor.  Singular right-sided posterior chain lymph node which is about 2 x 3 cm and tender to palpation.  The node is soft but I am unable to manipulated because the patient to much pain for me to palpate further. I'm unable to tell therefore if it is mobile. Cardiovascular: Normal rate, regular rhythm. Grossly normal heart sounds.  Good peripheral circulation. Respiratory: Normal respiratory effort.  No retractions. Lungs CTAB. Gastrointestinal: Soft with mild epigastric tenderness to palpation. No distention. No CVA tenderness. Musculoskeletal: No lower extremity tenderness nor edema.  No joint effusions. Neurologic:  Normal speech and language. No gross focal neurologic deficits are appreciated. No gait instability. Skin:  Skin is warm, dry and intact. No rash noted. Psychiatric: Mood and affect are normal. Speech and behavior are normal.  ____________________________________________   LABS (all labs ordered are listed, but only abnormal results are displayed)  Labs Reviewed  CBC - Abnormal; Notable for the following:    WBC 11.3 (*)    Hemoglobin 11.5 (*)    HCT 34.4 (*)    MCV 77.0 (*)    MCH 25.8 (*)    RDW 14.9 (*)    All other components within normal limits  URINALYSIS COMPLETEWITH MICROSCOPIC ([facility] ONLY) -  Abnormal; Notable for the following:    Color, Urine YELLOW (*)    APPearance CLEAR (*)    Bacteria, UA RARE (*)    Squamous Epithelial / LPF 6-30 (*)    All other components within normal limits  COMPREHENSIVE METABOLIC PANEL - Abnormal; Notable for the following:    Glucose, Bld 142 (*)    AST 12 (*)    ALT 12 (*)    All other components within normal limits  LIPASE,  BLOOD  TROPONIN I  POC URINE PREG, ED   ____________________________________________  EKG  ED ECG REPORT I, Rhandi Despain,  Teena Irani, the attending physician, personally viewed and interpreted this ECG.   Date: 10/09/2015  EKG Time: 2017  Rate: 79  Rhythm: normal sinus rhythm  Axis: Normal  Intervals:none  ST&T Change: T-wave inversion in lead V3. No ST elevation or depression.  ____________________________________________  RADIOLOGY   DG Chest 2 View (Final result) Result time: 10/09/15 20:46:47   Final result by Rad Results In Interface (10/09/15 20:46:47)   Narrative:   CLINICAL DATA: Chest pain and body aches since 10/07/2015.  EXAM: CHEST 2 VIEW  COMPARISON: PA and lateral chest 10/04/2006.  FINDINGS: The lungs are clear. Heart size is normal. No pneumothorax or pleural effusion. No bony abnormality.  IMPRESSION: Negative chest.   Electronically Signed By: Drusilla Kanner M.D. On: 10/09/2015 20:46       ____________________________________________   PROCEDURES   ____________________________________________   INITIAL IMPRESSION / ASSESSMENT AND PLAN / ED COURSE  Pertinent labs & imaging results that were available during my care of the patient were reviewed by me and considered in my medical decision making (see chart for details).  Patient with mildly elevated white blood cell count but with multiple white blood cell counts in the past with mild elevation. She is usually in the high normal to low abnormal range on her white blood cell  count.  ----------------------------------------- 10:23 PM on 10/09/2015 -----------------------------------------  Patient resting comfortably at this time. Tolerating by mouth fluids as well as crackers. Had a CAT scan without any acute findings as of yesterday. Doubt surgical pathology. Very reassuring workup otherwise. We'll discharge with symptomatic treatment at this time with Zofran as well as Bentyl. Possible viral cause versus acute on chronic symptoms. As per the patient and she is understanding willing to comply. She will also be following up with her primary care doctor who will be able to follow her note on her neck. I suspect that the node is reactive secondary to the current illness. ____________________________________________   FINAL CLINICAL IMPRESSION(S) / ED DIAGNOSES  Tender, singular lymph node of the right posterior lymph node chain.   Abdominal pain, epigastric with nausea vomiting and diarrhea.   NEW MEDICATIONS STARTED DURING THIS VISIT:  New Prescriptions   No medications on file     Note:  This document was prepared using Dragon voice recognition software and may include unintentional dictation errors.    Myrna Blazer, MD 10/09/15 2225

## 2017-03-26 IMAGING — CR DG CHEST 2V
2 series · 2 of 2 positions shown · non-contrast
Comparison: PA and lateral chest 10/04/2006.

CLINICAL DATA: Chest pain and body aches since 10/07/2015.

EXAM:
CHEST  2 VIEW

[chest pa]
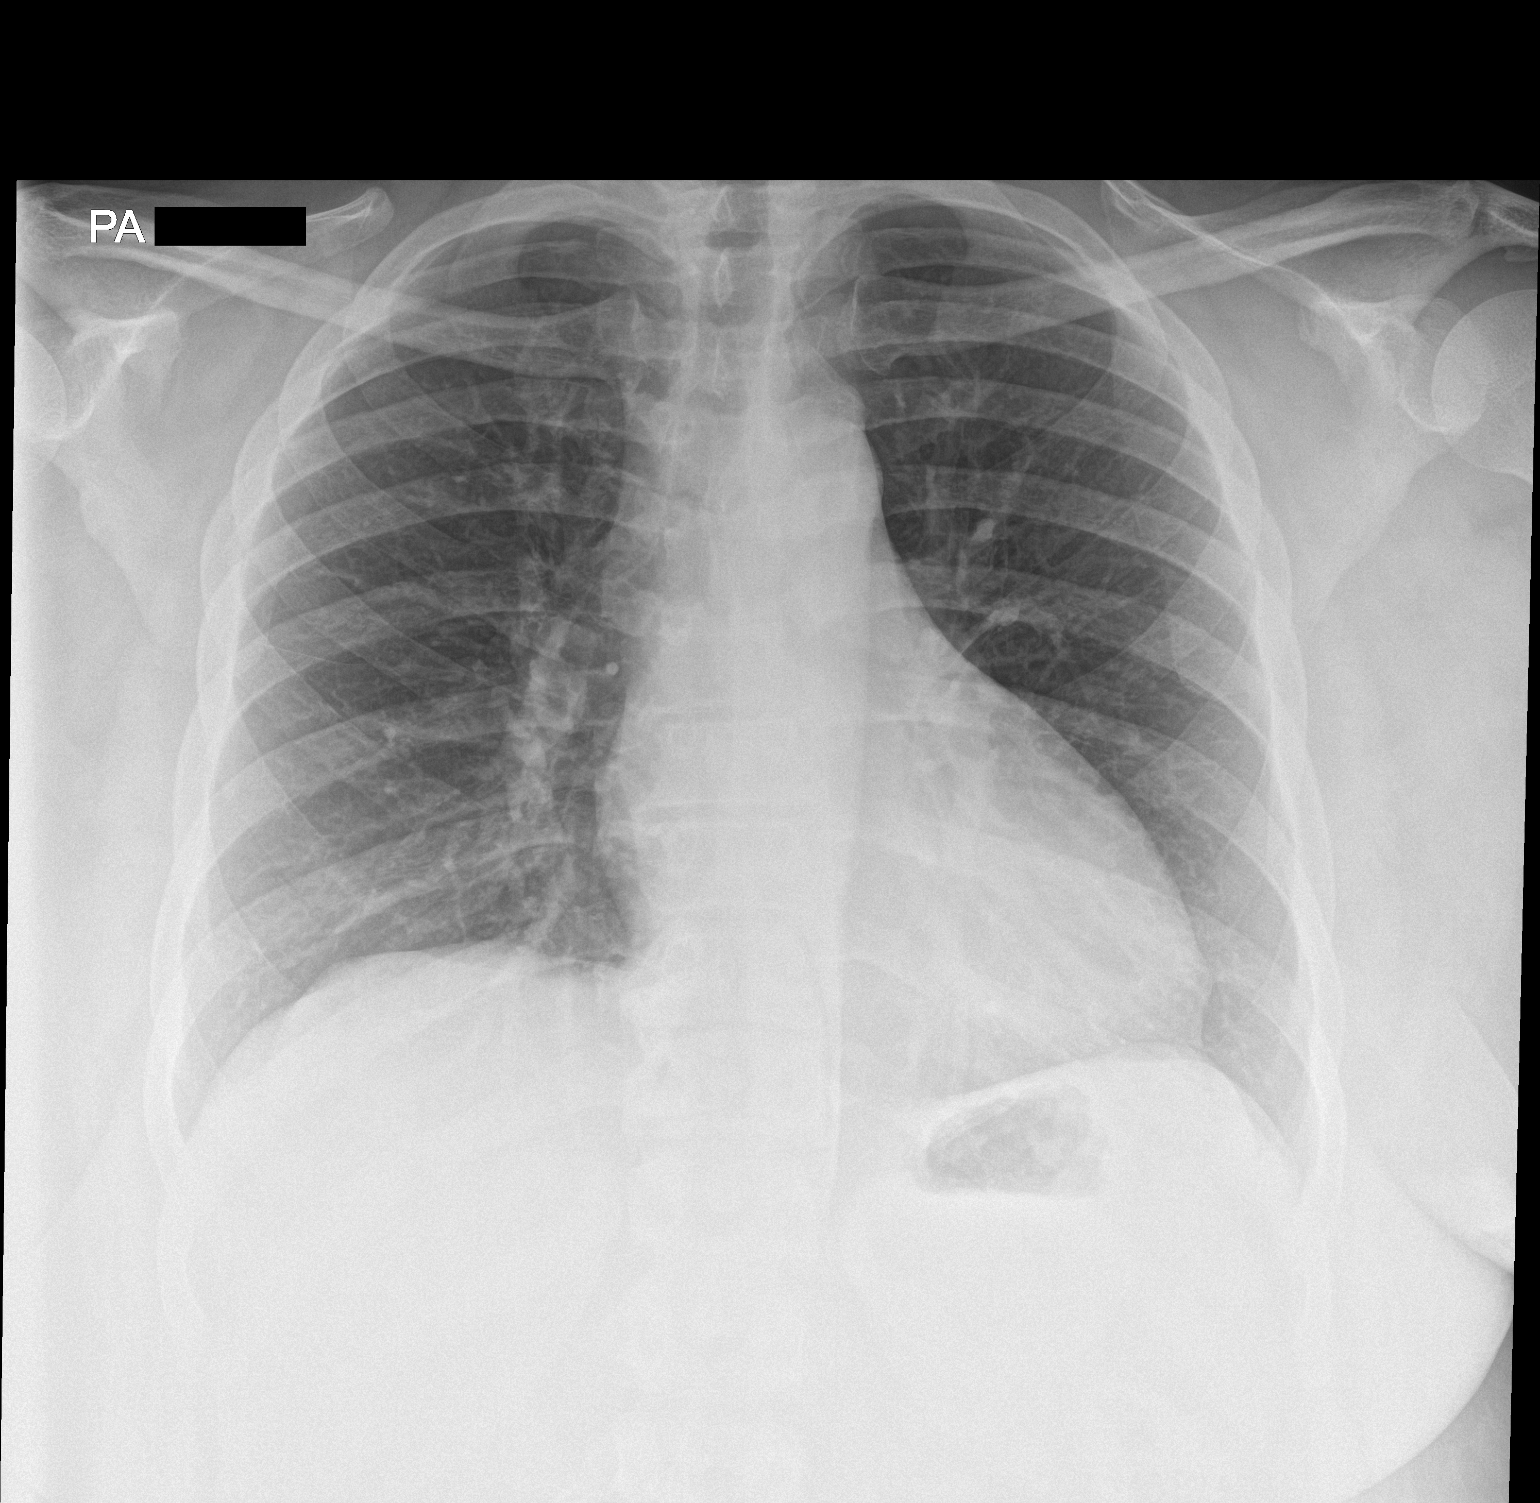

[chest lat]
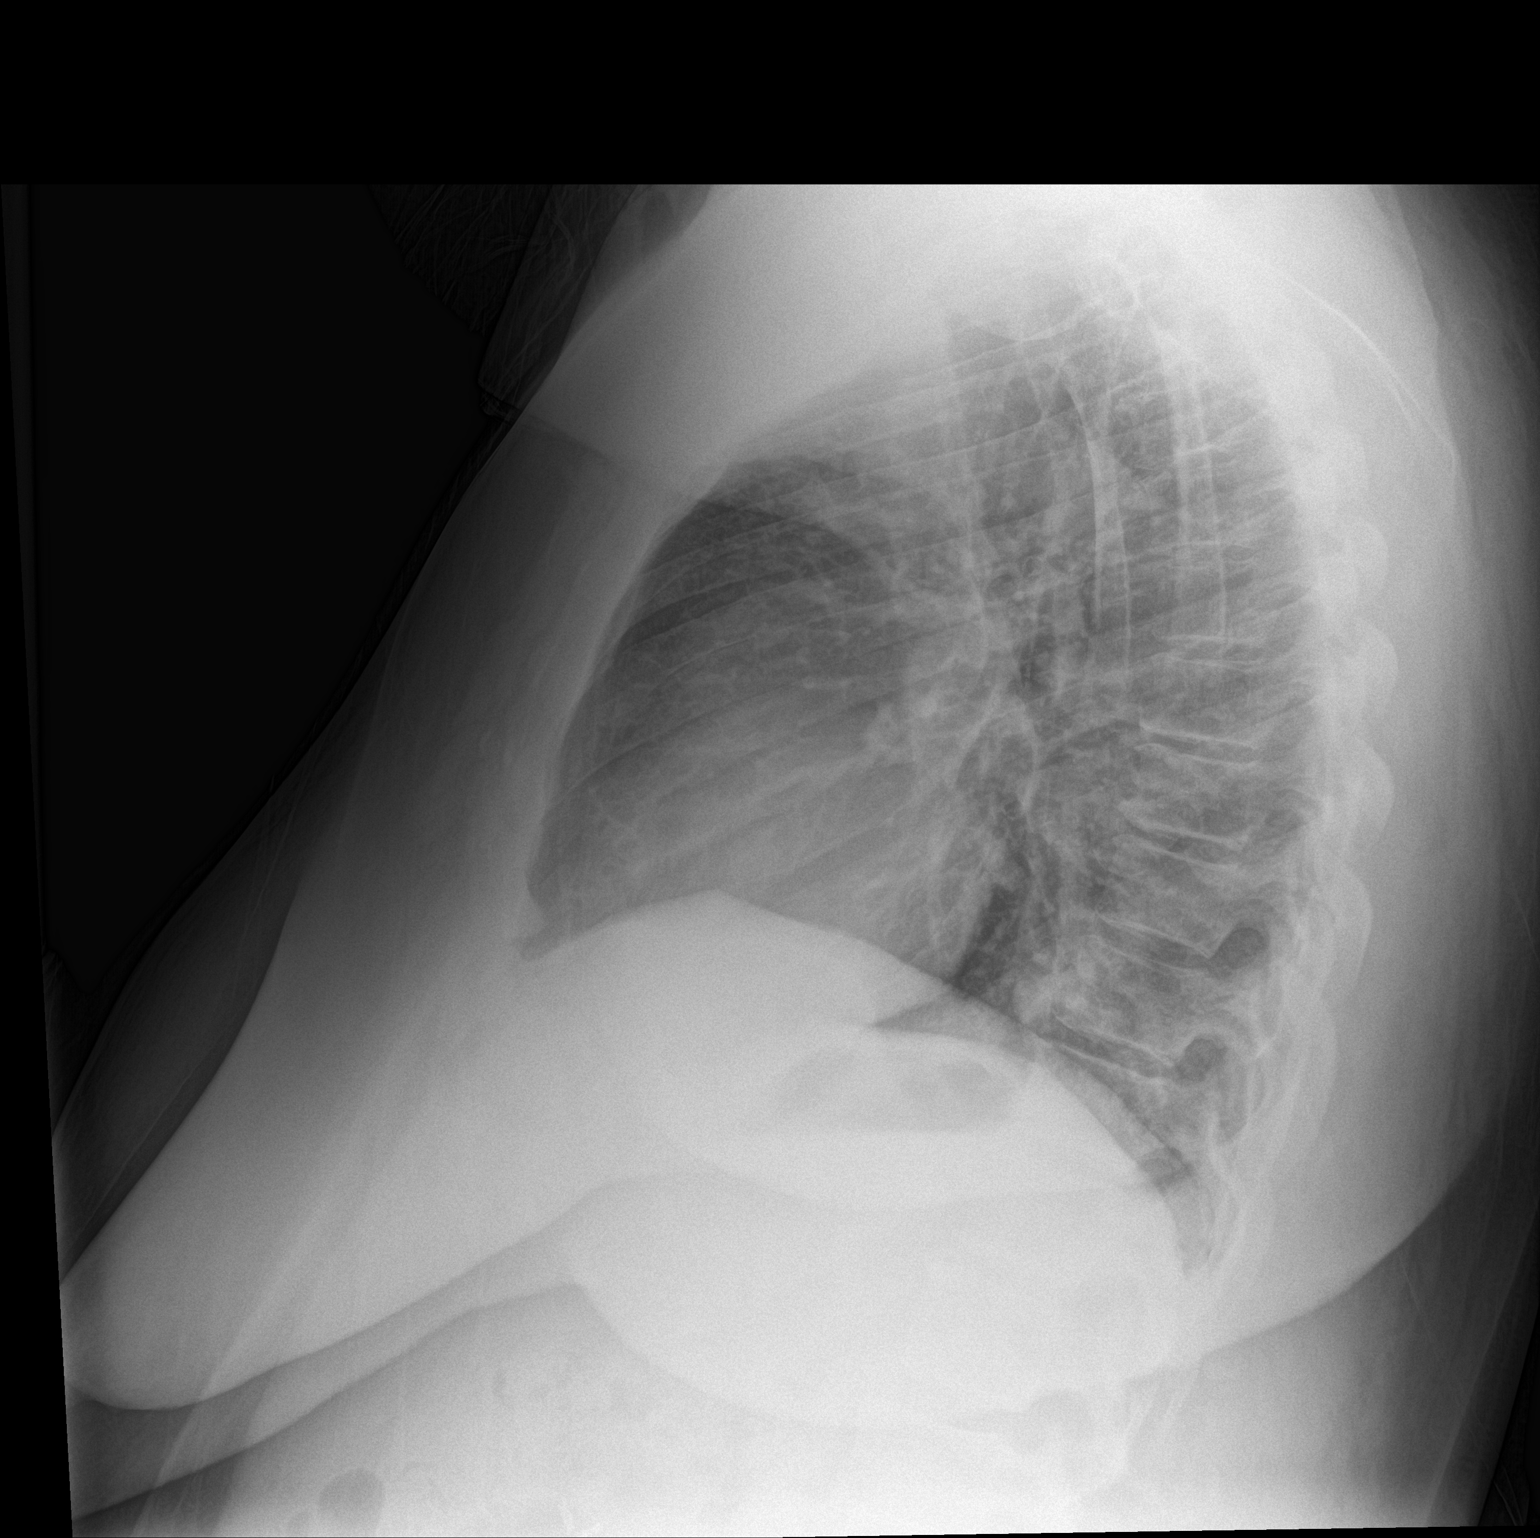

[2 of 2 positions shown; findings below may reference images not displayed]

FINDINGS: The lungs are clear. Heart size is normal. No pneumothorax or
pleural effusion. No bony abnormality.
IMPRESSION: Negative chest.

## 2017-08-27 ENCOUNTER — Emergency Department
Admission: EM | Admit: 2017-08-27 | Discharge: 2017-08-27 | Disposition: A | Discharge status: Home / Self Care | Payer: BLUE CROSS/BLUE SHIELD | Attending: Emergency Medicine | Admitting: Emergency Medicine

## 2017-08-27 ENCOUNTER — Encounter: Payer: Self-pay | Admitting: Emergency Medicine

## 2017-08-27 DIAGNOSIS — Z794 Long term (current) use of insulin: Secondary | ICD-10-CM | POA: Insufficient documentation

## 2017-08-27 DIAGNOSIS — F17210 Nicotine dependence, cigarettes, uncomplicated: Secondary | ICD-10-CM | POA: Insufficient documentation

## 2017-08-27 DIAGNOSIS — Z79899 Other long term (current) drug therapy: Secondary | ICD-10-CM | POA: Diagnosis not present

## 2017-08-27 DIAGNOSIS — E119 Type 2 diabetes mellitus without complications: Secondary | ICD-10-CM | POA: Diagnosis not present

## 2017-08-27 DIAGNOSIS — I10 Essential (primary) hypertension: Secondary | ICD-10-CM | POA: Insufficient documentation

## 2017-08-27 DIAGNOSIS — R519 Headache, unspecified: Secondary | ICD-10-CM

## 2017-08-27 DIAGNOSIS — R51 Headache: Secondary | ICD-10-CM | POA: Insufficient documentation

## 2017-08-27 MED ORDER — TRAMADOL HCL 50 MG PO TABS
50.0000 mg | ORAL_TABLET | Freq: Once | ORAL | Status: AC
  Administered 2017-08-27: 50 mg via ORAL
  Filled 2017-08-27: qty 1

## 2017-08-27 MED ORDER — PSEUDOEPHEDRINE HCL ER 120 MG PO TB12
120.0000 mg | ORAL_TABLET | Freq: Once | ORAL | Status: AC
  Administered 2017-08-27: 120 mg via ORAL
  Filled 2017-08-27: qty 1

## 2017-08-27 MED ORDER — KETOROLAC TROMETHAMINE 60 MG/2ML IM SOLN
60.0000 mg | Freq: Once | INTRAMUSCULAR | Status: AC
  Administered 2017-08-27: 60 mg via INTRAMUSCULAR
  Filled 2017-08-27: qty 2

## 2017-08-27 MED ORDER — PSEUDOEPHEDRINE HCL ER 120 MG PO TB12: 120.0000 mg | ORAL_TABLET | Freq: Two times a day (BID) | ORAL | 2 refills | Status: DC | PRN

## 2017-08-27 MED ORDER — BUTALBITAL-APAP-CAFFEINE 50-325-40 MG PO TABS: 1.0000 | ORAL_TABLET | Freq: Four times a day (QID) | ORAL | 0 refills | Status: DC | PRN

## 2017-08-27 NOTE — ED Notes (Signed)
First RN note:  Patient arrives via [facility] from home c/o headache that started last night.  Patient is being treated for HTN, and had HTN today with EMS.  Neurologically intact at this time and in NAD. Patient denies taking any medication to help with the headache at this time.

## 2017-08-27 NOTE — ED Notes (Signed)
See triage note  Presents with frontal headache,with some nausea and vomiting and body aches  Sx's started yesterday  Unsure of fever but has had cold chills  Low grade temp on arrival

## 2017-08-27 NOTE — ED Provider Notes (Signed)
[facility] Emergency Department Provider Note   ____________________________________________   First MD Initiated Contact with Patient 08/27/17 1239     (approximate)  I have reviewed the triage vital signs and the nursing notes.   HISTORY  Chief Complaint Headache    HPI Shelly Benjamin is a 47 y.o. female patient complained of frontal headache started yesterday.  Patient stated onset was with sinus congestion.  Patient not taking anything for headache.  Patient arrived via EMS.  EMS they had a blood pressure was initially high but was normotensive when she arrived in triage.  Patient described the headache as "pressure".  Patient denies vision disturbance, vertigo, nausea, or vomiting.  Patient rates the pain as a 9/10.  No palliative measure for complaint.   Past Medical History:  Diagnosis Date  . Diabetes mellitus without complication (HCC)   . GERD (gastroesophageal reflux disease)   . Hypertension     There are no active problems to display for this patient.   Past Surgical History:  Procedure Laterality Date  . CHOLECYSTECTOMY    . TUBAL LIGATION      Prior to Admission medications   Medication Sig Start Date End Date Taking? Authorizing Provider  amLODipine (NORVASC) 10 MG tablet Take 10 mg by mouth daily.    [provider]  butalbital-acetaminophen-caffeine (FIORICET, ESGIC) [phone] MG tablet Take 1-2 tablets by mouth every 6 (six) hours as needed for headache. 08/27/17 08/27/18  Joni Reining, PA-C  dicyclomine (BENTYL) 20 MG tablet Take 1 tablet (20 mg total) by mouth 3 (three) times daily as needed for spasms. 10/09/15 10/08/16  Myrna Blazer, MD  famotidine (PEPCID) 20 MG tablet Take 1 tablet (20 mg total) by mouth 2 (two) times daily. 07/13/15   Emily Filbert, MD  fluconazole (DIFLUCAN) 150 MG tablet Take 1 tablet (150 mg total) by mouth once. 09/29/15   Hassan Rowan, MD  hydrochlorothiazide (HYDRODIURIL)  25 MG tablet Take 25 mg by mouth daily.    [provider]  insulin NPH Human (HUMULIN N,NOVOLIN N) 100 UNIT/ML injection Inject 12 Units into the skin 2 (two) times daily before a meal.    [provider]  lisinopril (PRINIVIL,ZESTRIL) 10 MG tablet Take 10 mg by mouth daily.    [provider]  metroNIDAZOLE (FLAGYL) 500 MG tablet Take 1 tablet (500 mg total) by mouth 2 (two) times daily. 09/29/15   Hassan Rowan, MD  ondansetron (ZOFRAN) 4 MG tablet Take 1 tablet (4 mg total) by mouth daily as needed. 10/09/15   Myrna Blazer, MD  pseudoephedrine (SUDAFED) 120 MG 12 hr tablet Take 1 tablet (120 mg total) by mouth 2 (two) times daily as needed for congestion. 08/27/17 08/27/18  Joni Reining, PA-C  terconazole (TERAZOL 3) 80 MG vaginal suppository Place 1 suppository (80 mg total) vaginally at bedtime. 09/29/15   Hassan Rowan, MD    Allergies Morphine and related and Naproxen  History reviewed. No pertinent family history.  Social History Social History   Tobacco Use  . Smoking status: Current Every Day Smoker    Packs/day: 0.50    Types: Cigarettes  . Smokeless tobacco: Never Used  Substance Use Topics  . Alcohol use: No  . Drug use: Not on file    Review of Systems  Constitutional: No fever/chills Eyes: No visual changes. ENT: No sore throat. Cardiovascular: Denies chest pain. Respiratory: Denies shortness of breath. Gastrointestinal: No abdominal pain.  No nausea, no vomiting.  No diarrhea.  No constipation. Genitourinary: Negative for dysuria. Musculoskeletal: Negative for back pain. Skin: Negative for rash. Neurological: Negative for headaches, focal weakness or numbness. Endocrine:Diabetes and hypertension Hematological/Lymphatic: Allergic/Immunilogical: Morphine and naproxen. ____________________________________________   PHYSICAL EXAM:  VITAL SIGNS: ED Triage Vitals  Enc Vitals Group     BP 08/27/17 1217 136/89     Pulse Rate  08/27/17 1217 (!) 115     Resp 08/27/17 1217 18     Temp 08/27/17 1217 100 F (37.8 C)     Temp Source 08/27/17 1217 Oral     SpO2 08/27/17 1217 98 %     Weight 08/27/17 1218 300 lb (136.1 kg)     Height 08/27/17 1218  (1.702 m)     Head Circumference --      Peak Flow --      Pain Score 08/27/17 1218 9     Pain Loc --      Pain Edu? --      Excl. in GC? --    Constitutional: Alert and oriented. Well appearing and in no acute distress. Eyes: Conjunctivae are normal. PERRL. EOMI. Head: Atraumatic. Nose: Bilateral frontal and maxillary guarding.  Edematous nasal turbinates clear rhinorrhea. Mouth/Throat: Mucous membranes are moist.  Oropharynx non-erythematous.  Postnasal drainage. Neck: No stridor.  Cardiovascular: Normal rate, regular rhythm. Grossly normal heart sounds.  Good peripheral circulation. Respiratory: Normal respiratory effort.  No retractions. Lungs CTAB. Neurologic:  Normal speech and language. No gross focal neurologic deficits are appreciated. No gait instability. Skin:  Skin is warm, dry and intact. No rash noted. Psychiatric: Mood and affect are normal. Speech and behavior are normal.  ____________________________________________   LABS (all labs ordered are listed, but only abnormal results are displayed)  Labs Reviewed - No data to display ____________________________________________  EKG   ____________________________________________  RADIOLOGY  ED MD interpretation:    Official radiology report(s): No results found.  ____________________________________________   PROCEDURES  Procedure(s) performed: None  Procedures  Critical Care performed: No  ____________________________________________   INITIAL IMPRESSION / ASSESSMENT AND PLAN / ED COURSE  As part of my medical decision making, I reviewed the following data within the electronic MEDICAL RECORD NUMBER    Patient presented with headache secondary to sinus congestion.  Patient  given discharge care instruction advised take medication as directed.  Patient advised follow-up with PCP if no improvement in 2 to 3 days.      ____________________________________________   FINAL CLINICAL IMPRESSION(S) / ED DIAGNOSES  Final diagnoses:  Sinus headache     ED Discharge Orders        Ordered    pseudoephedrine (SUDAFED) 120 MG 12 hr tablet  2 times daily PRN     08/27/17 1406    butalbital-acetaminophen-caffeine (FIORICET, ESGIC) 50-325-40 MG tablet  Every 6 hours PRN     08/27/17 1406       Note:  This document was prepared using Dragon voice recognition software and may include unintentional dictation errors.    Joni Reining, PA-C 08/27/17 1411    Dionne Bucy, MD 08/27/17 1530

## 2017-08-27 NOTE — Discharge Instructions (Signed)
Take medication as directed.

## 2017-08-27 NOTE — ED Triage Notes (Signed)
Pt to ED with c/o of headache that started yesterday. Pt states she has not taken any medication for her headache. Pt was HTN per EMS but BP in triage 136/89.

## 2017-08-27 NOTE — ED Notes (Signed)
Pt verbalizes understanding of d/c instructions, medications and follow up 

## 2017-09-04 ENCOUNTER — Ambulatory Visit
Admission: EM | Admit: 2017-09-04 | Discharge: 2017-09-04 | Disposition: A | Discharge status: Home / Self Care | Payer: BLUE CROSS/BLUE SHIELD | Attending: Family Medicine | Admitting: Family Medicine

## 2017-09-04 ENCOUNTER — Encounter: Payer: Self-pay | Admitting: Emergency Medicine

## 2017-09-04 ENCOUNTER — Other Ambulatory Visit: Payer: Self-pay

## 2017-09-04 DIAGNOSIS — J029 Acute pharyngitis, unspecified: Secondary | ICD-10-CM

## 2017-09-04 DIAGNOSIS — J019 Acute sinusitis, unspecified: Secondary | ICD-10-CM

## 2017-09-04 HISTORY — DX: Hyperlipidemia, unspecified: E78.5

## 2017-09-04 LAB — RAPID STREP SCREEN (MED CTR MEBANE ONLY): STREPTOCOCCUS, GROUP A SCREEN (DIRECT): NEGATIVE

## 2017-09-04 MED ORDER — AMOXICILLIN-POT CLAVULANATE 875-125 MG PO TABS: 1.0000 | ORAL_TABLET | Freq: Two times a day (BID) | ORAL | 0 refills | Status: DC

## 2017-09-04 NOTE — ED Triage Notes (Signed)
Patient stating she started with body aches, dizzy and fatigue last Wednesday (08/26/17). Patient states her BP 255/183 and patient was transported to [facility]. Patient was diagnosed with a severe sinus infection. Patient states everything started draining and she had ear and sore throat pain. Now patient states she is having a cough, headache, chills, and sinus congestion and dental pain. Patient hasn't taken temperature, but has felt feverish.

## 2017-09-04 NOTE — ED Provider Notes (Signed)
[facility]    CSN: [id number] Arrival date & time: 09/04/17  0810  History   Chief Complaint Chief Complaint  Patient presents with  . Otalgia   HPI  47 year old female presents with upper respiratory symptoms.  Patient reports that she has been sick since Wednesday of last week.  She initially developed body aches.  She states that she worsened and was taken to the emergency department on 5/2.  She was diagnosed with a headache secondary to sinus congestion.  She was treated with Fioricet and Sudafed.  Patient states that she is failed to improve.  She continues to have symptoms.  She reports sore throat, ear pain, cough, sinus pressure and pain.  She reports associated dental discomfort.  No reports of fever.  No known exacerbating relieving factors.  No other associated symptoms.  No other complaints.  Past Medical History:  Diagnosis Date  . Diabetes mellitus without complication (HCC)   . GERD (gastroesophageal reflux disease)   . Hyperlipidemia   . Hypertension    Past Surgical History:  Procedure Laterality Date  . CHOLECYSTECTOMY    . TUBAL LIGATION     OB History   None    Home Medications    Prior to Admission medications   Medication Sig Start Date End Date Taking? Authorizing Provider  atorvastatin (LIPITOR) 40 MG tablet Take 1 tablet by mouth at bedtime. 08/21/17  Yes [provider]  hydrochlorothiazide (HYDRODIURIL) 25 MG tablet Take 25 mg by mouth daily.   Yes [provider]  insulin NPH Human (HUMULIN N,NOVOLIN N) 100 UNIT/ML injection Inject 12 Units into the skin 2 (two) times daily before a meal.   Yes [provider]  lisinopril (PRINIVIL,ZESTRIL) 10 MG tablet Take 10 mg by mouth daily.   Yes [provider]  omeprazole (PRILOSEC) 40 MG capsule Take 1 capsule by mouth daily. 08/21/17  Yes [provider]  pseudoephedrine (SUDAFED) 120 MG 12 hr tablet Take 1 tablet (120 mg total) by mouth 2 (two)  times daily as needed for congestion. 08/27/17 08/27/18 Yes Joni Reining, PA-C  amoxicillin-clavulanate (AUGMENTIN) 875-125 MG tablet Take 1 tablet by mouth every 12 (twelve) hours. 09/04/17   Tommie Sams, DO  butalbital-acetaminophen-caffeine Alice, ESGIC) [phone] MG tablet Take 1-2 tablets by mouth every 6 (six) hours as needed for headache. 08/27/17 08/27/18  Joni Reining, PA-C   Family History Family History  Problem Relation Age of Onset  . Diabetes Mother   . Heart failure Mother   . Kidney failure Mother   . COPD Mother   . Hypertension Mother   . Other Father        "haw river syndrome"   Social History Social History   Tobacco Use  . Smoking status: Current Every Day Smoker    Packs/day: 0.50    Types: Cigarettes  . Smokeless tobacco: Never Used  Substance Use Topics  . Alcohol use: No  . Drug use: Never   Allergies   Morphine and related and Naproxen  Review of Systems Review of Systems Per HPI  Physical Exam Triage Vital Signs ED Triage Vitals [09/04/17 0827]  Enc Vitals Group     BP (!) 132/107     Pulse Rate (!) 113     Resp 16     Temp 98.6 F (37 C)     Temp Source Oral     SpO2 99 %     Weight 300 lb (136.1 kg)  Height  (1.702 m)     Head Circumference      Peak Flow      Pain Score 8     Pain Loc      Pain Edu?      Excl. in GC?    Updated Vital Signs BP (!) 132/107 (BP Location: Left Arm) Comment: lg cuff  Pulse (!) 113   Temp 98.6 F (37 C) (Oral)   Resp 16   Ht  (1.702 m)   Wt 300 lb (136.1 kg)   LMP 08/21/2017 Comment: tubal ligation  SpO2 99%   BMI 46.99 kg/m   Physical Exam  Constitutional: She is oriented to person, place, and time. She appears well-developed. No distress.  HENT:  Head: Normocephalic and atraumatic.  Mouth/Throat: Oropharynx is clear and moist.  Maxillary sinus tenderness palpation on the left.  Cardiovascular: Normal rate and regular rhythm.  Pulmonary/Chest: Effort normal and breath  sounds normal. She has no wheezes. She has no rales.  Neurological: She is alert and oriented to person, place, and time.  Psychiatric: She has a normal mood and affect. Her behavior is normal.  Nursing note and vitals reviewed.  UC Treatments / Results  Labs (all labs ordered are listed, but only abnormal results are displayed) Labs Reviewed  RAPID STREP SCREEN ([facility] ONLY)  CULTURE, GROUP A STREP [facility])    EKG None  Radiology No results found.  Procedures Procedures (including critical care time)  Medications Ordered in UC Medications - No data to display  Initial Impression / Assessment and Plan / UC Course  I have reviewed the triage vital signs and the nursing notes.  Pertinent labs & imaging results that were available during my care of the patient were reviewed by me and considered in my medical decision making (see chart for details).    47 year old female presents with sinusitis.  Treating with Augmentin.  Final Clinical Impressions(s) / UC Diagnoses   Final diagnoses:  Acute sinusitis, recurrence not specified, unspecified location   Discharge Instructions   None    ED Prescriptions    Medication Sig Dispense Auth. Provider   amoxicillin-clavulanate (AUGMENTIN) 875-125 MG tablet Take 1 tablet by mouth every 12 (twelve) hours. 14 tablet Tommie Sams, DO     Controlled Substance Prescriptions [place] Controlled Substance Registry consulted? Not Applicable   Tommie Sams, [place] 09/04/17 0981

## 2017-09-06 LAB — CULTURE, GROUP A STREP (THRC)

## 2018-08-06 ENCOUNTER — Encounter: Payer: Self-pay | Admitting: Emergency Medicine

## 2018-08-06 ENCOUNTER — Ambulatory Visit
Admission: EM | Admit: 2018-08-06 | Discharge: 2018-08-06 | Disposition: A | Discharge status: Home / Self Care | Payer: BLUE CROSS/BLUE SHIELD | Attending: Family Medicine | Admitting: Family Medicine

## 2018-08-06 ENCOUNTER — Other Ambulatory Visit: Payer: Self-pay

## 2018-08-06 DIAGNOSIS — L02412 Cutaneous abscess of left axilla: Secondary | ICD-10-CM | POA: Diagnosis not present

## 2018-08-06 DIAGNOSIS — I10 Essential (primary) hypertension: Secondary | ICD-10-CM | POA: Diagnosis not present

## 2018-08-06 MED ORDER — DOXYCYCLINE HYCLATE 100 MG PO CAPS: 100.0000 mg | ORAL_CAPSULE | Freq: Two times a day (BID) | ORAL | 0 refills | Status: DC

## 2018-08-06 MED ORDER — MUPIROCIN 2 % EX OINT: TOPICAL_OINTMENT | CUTANEOUS | 0 refills | Status: DC

## 2018-08-06 NOTE — ED Triage Notes (Signed)
Pt c/o abscess  under her left axilla. Started about 5 days ago.

## 2018-08-06 NOTE — Discharge Instructions (Addendum)
Take medication as prescribed. Rest. Drink plenty of fluids. Warm compresses.   Follow up with your primary care physician this week.. Return to Urgent care or proceed to emergency room for new or worsening concerns.

## 2018-08-06 NOTE — ED Provider Notes (Signed)
[facility] ____________________________________________  Time seen: Approximately 4:11 PM  I have reviewed the triage vital signs and the nursing notes.   HISTORY  Chief Complaint Abscess   HPI Shelly Benjamin is a 48 y.o. female history of diabetes, hypertension, renal insufficiency presenting for evaluation of left axilla tenderness present for the last week.  States she started off with one small bump that has increased in size.  States she tried to express any drainage, but states no drainage present.  Tender to touch.  Denies other aggravating alleviating factors.  Mild discomfort currently.  Denies any pain radiation, other skin changes, known trigger, insect bite.  States history of similar to right axilla several years ago that went away with antibiotics.  Denies known history of MRSA.  Denies fevers, chest pain, shortness of breath, cough or recent sickness.  No recent antibiotic use.  Reports otherwise doing well.  Patient's last menstrual period was 07/31/2018.  As above.   Past Medical History:  Diagnosis Date  . Diabetes mellitus without complication (HCC)   . GERD (gastroesophageal reflux disease)   . Hyperlipidemia   . Hypertension     There are no active problems to display for this patient.   Past Surgical History:  Procedure Laterality Date  . CHOLECYSTECTOMY    . TUBAL LIGATION       No current facility-administered medications for this encounter.   Current Outpatient Medications:  .  atorvastatin (LIPITOR) 40 MG tablet, Take 1 tablet by mouth at bedtime., Disp: , Rfl:  .  hydrochlorothiazide (HYDRODIURIL) 25 MG tablet, Take 25 mg by mouth daily., Disp: , Rfl:  .  insulin NPH Human (HUMULIN N,NOVOLIN N) 100 UNIT/ML injection, Inject 12 Units into the skin 2 (two) times daily before a meal., Disp: , Rfl:  .  lisinopril (PRINIVIL,ZESTRIL) 10 MG tablet, Take 10 mg by mouth daily., Disp: , Rfl:  .  omeprazole (PRILOSEC) 40 MG capsule, Take 1  capsule by mouth daily., Disp: , Rfl:  .  doxycycline (VIBRAMYCIN) 100 MG capsule, Take 1 capsule (100 mg total) by mouth 2 (two) times daily., Disp: 20 capsule, Rfl: 0 .  mupirocin ointment (BACTROBAN) 2 %, Apply two times a day for 7 days., Disp: 22 g, Rfl: 0  Allergies Morphine and related and Naproxen  Family History  Problem Relation Age of Onset  . Diabetes Mother   . Heart failure Mother   . Kidney failure Mother   . COPD Mother   . Hypertension Mother   . Other Father        "haw river syndrome"    Social History Social History   Tobacco Use  . Smoking status: Former Smoker    Packs/day: 0.50    Types: Cigarettes    Last attempt to quit: 05/07/2018    Years since quitting: 0.2  . Smokeless tobacco: Never Used  Substance Use Topics  . Alcohol use: No  . Drug use: Never    Review of Systems Constitutional: No fever ENT: No sore throat. Cardiovascular: Denies chest pain. Respiratory: Denies shortness of breath. Gastrointestinal: No abdominal pain.  Musculoskeletal: Negative for back pain. Skin: As above  ____________________________________________   PHYSICAL EXAM:  VITAL SIGNS: ED Triage Vitals  Enc Vitals Group     BP 08/06/18 1548 (!) 168/109     Pulse Rate 08/06/18 1548 (!) 102     Resp 08/06/18 1548 18     Temp 08/06/18 1548 98.1 F (36.7 C)  Temp Source 08/06/18 1548 Oral     SpO2 08/06/18 1548 99 %     Weight 08/06/18 1545 291 lb (132 kg)     Height 08/06/18 1545 5\' 6"  (1.676 m)     Head Circumference --      Peak Flow --      Pain Score 08/06/18 1545 8     Pain Loc --      Pain Edu? --      Excl. in GC? --     Constitutional: Alert and oriented. Well appearing and in no acute distress. ENT      Head: Normocephalic and atraumatic. Cardiovascular: Normal rate, regular rhythm. Grossly normal heart sounds.  Good peripheral circulation. Respiratory: Normal respiratory effort without tachypnea nor retractions. Breath sounds are clear and  equal bilaterally. No wheezes, rales, rhonchi. Gastrointestinal: Obese abdomen Musculoskeletal: Steady gait. Neurologic:  Normal speech and language. Speech is normal. No gait instability.  Skin:  Skin is warm, dry.  Except: Left axilla area of approximately 1 x 2 cm and adjacent 1 x 1 cm area of induration with minimal erythema, no fluctuance, mild tenderness to palpation, no surrounding tenderness, patient unable to fully abduct left arm due to baseline issue per patient. Psychiatric: Mood and affect are normal. Speech and behavior are normal. Patient exhibits appropriate insight and judgment   ___________________________________________   LABS (all labs ordered are listed, but only abnormal results are displayed)  Labs Reviewed - No data to display   Via care everywhere, 05/14/2018 creatinine 1.24. ____________________________________________    PROCEDURES Procedures   INITIAL IMPRESSION / ASSESSMENT AND PLAN / ED COURSE  Pertinent labs & imaging results that were available during my care of the patient were reviewed by me and considered in my medical decision making (see chart for details).  Well-appearing patient.  No acute distress.  Left axilla induration abscess.  No I&D indicated at this time.  Also further complicated by patient states that she has a "frozen shoulder "and unable to fully abduct left shoulder.  We will start patient on oral doxycycline and topical Bactroban.  Warm compresses and supportive care.  Discussed close monitoring.Discussed indication, risks and benefits of medications with patient.  Discussed follow up with Primary care physician this week. Discussed follow up and return parameters including no resolution or any worsening concerns. Patient verbalized understanding and agreed to plan.   ____________________________________________   FINAL CLINICAL IMPRESSION(S) / ED DIAGNOSES  Final diagnoses:  Abscess of left axilla     ED Discharge Orders          Ordered    doxycycline (VIBRAMYCIN) 100 MG capsule  2 times daily     08/06/18 1606    mupirocin ointment (BACTROBAN) 2 %     08/06/18 1606           Note: This dictation was prepared with Dragon dictation along with smaller phrase technology. Any transcriptional errors that result from this process are unintentional.         Renford Dills, NP 08/06/18 1622

## 2018-11-09 ENCOUNTER — Telehealth: Payer: Self-pay

## 2018-11-09 NOTE — Telephone Encounter (Signed)
Spoke with patient in regards to COVID like symptoms. Patient states she has an appointment for testing on 11/10/18 at 0930.

## 2018-11-11 ENCOUNTER — Telehealth: Payer: Self-pay

## 2018-11-11 NOTE — Telephone Encounter (Signed)
Received call from patient; stated her COVID results were negative but she is still exhibiting signs/symptoms such as chills, body aches, flu like symptoms, cough, runny nose, GI upset. Patient stated she is going to follow up with her PCP related to her symptoms.

## 2018-11-11 NOTE — Telephone Encounter (Signed)
Received call from Dr Aggie Hacker in reference to patient symptoms. MD stated that patient should remain out of work until she is fever free for 3 days & respiratory symptom free 3 days due to COVID-19 test being negative.

## 2018-11-17 ENCOUNTER — Telehealth: Payer: Self-pay

## 2018-11-17 NOTE — Telephone Encounter (Signed)
Received call from patient stating she is feeling much better. She did state that she had diarrhea on 11/13/18-11/14/18 but no diarrhea noted today 11/17/18. Patient stated she will be returning to work tonight 11/17/18.

## 2018-11-18 ENCOUNTER — Ambulatory Visit: Payer: Self-pay

## 2018-11-18 ENCOUNTER — Encounter (INDEPENDENT_AMBULATORY_CARE_PROVIDER_SITE_OTHER): Payer: Self-pay

## 2018-11-18 ENCOUNTER — Other Ambulatory Visit: Payer: Self-pay | Admitting: Internal Medicine

## 2018-11-18 ENCOUNTER — Telehealth: Payer: Self-pay

## 2018-11-18 ENCOUNTER — Other Ambulatory Visit: Payer: Self-pay

## 2018-11-18 VITALS — BP 150/98 | HR 98 | Temp 98.0°F | Resp 20

## 2018-11-18 DIAGNOSIS — Z20822 Contact with and (suspected) exposure to covid-19: Secondary | ICD-10-CM

## 2018-11-18 DIAGNOSIS — I10 Essential (primary) hypertension: Secondary | ICD-10-CM

## 2018-11-18 DIAGNOSIS — E1110 Type 2 diabetes mellitus with ketoacidosis without coma: Secondary | ICD-10-CM

## 2018-11-18 LAB — GLUCOSE, POCT (MANUAL RESULT ENTRY): POC Glucose: 317 mg/dl — AB (ref 70–99)

## 2018-11-18 NOTE — Addendum Note (Signed)
Addended by: Concha Pyo on: 11/18/2018 12:03 PM   Modules accepted: Orders

## 2018-11-18 NOTE — Patient Instructions (Signed)
Diabetes Basics  Diabetes (diabetes mellitus) is a long-term (chronic) disease. It occurs when the body does not properly use sugar (glucose) that is released from food after you eat. Diabetes may be caused by one or both of these problems:  Your pancreas does not make enough of a hormone called insulin.  Your body does not react in a normal way to insulin that it makes. Insulin lets sugars (glucose) go into cells in your body. This gives you energy. If you have diabetes, sugars cannot get into cells. This causes high blood sugar (hyperglycemia). Follow these instructions at home: How is diabetes treated? You may need to take insulin or other diabetes medicines daily to keep your blood sugar in balance. Take your diabetes medicines every day as told by your doctor. List your diabetes medicines here: Diabetes medicines  Name of medicine: ______________________________ ? Amount (dose): _______________ Time (a.m./p.m.): _______________ Notes: ___________________________________  Name of medicine: ______________________________ ? Amount (dose): _______________ Time (a.m./p.m.): _______________ Notes: ___________________________________  Name of medicine: ______________________________ ? Amount (dose): _______________ Time (a.m./p.m.): _______________ Notes: ___________________________________ If you use insulin, you will learn how to give yourself insulin by injection. You may need to adjust the amount based on the food that you eat. List the types of insulin you use here: Insulin  Insulin type: ______________________________ ? Amount (dose): _______________ Time (a.m./p.m.): _______________ Notes: ___________________________________  Insulin type: ______________________________ ? Amount (dose): _______________ Time (a.m./p.m.): _______________ Notes: ___________________________________  Insulin type: ______________________________ ? Amount (dose): _______________ Time (a.m./p.m.):  _______________ Notes: ___________________________________  Insulin type: ______________________________ ? Amount (dose): _______________ Time (a.m./p.m.): _______________ Notes: ___________________________________  Insulin type: ______________________________ ? Amount (dose): _______________ Time (a.m./p.m.): _______________ Notes: ___________________________________ How do I manage my blood sugar?  Check your blood sugar levels using a blood glucose monitor as directed by your doctor. Your doctor will set treatment goals for you. Generally, you should have these blood sugar levels:  Before meals (preprandial): 80-130 mg/dL (4.4-7.2 mmol/L).  After meals (postprandial): below 180 mg/dL (10 mmol/L).  A1c level: less than 7%. Write down the times that you will check your blood sugar levels: Blood sugar checks  Time: _______________ Notes: ___________________________________  Time: _______________ Notes: ___________________________________  Time: _______________ Notes: ___________________________________  Time: _______________ Notes: ___________________________________  Time: _______________ Notes: ___________________________________  Time: _______________ Notes: ___________________________________  What do I need to know about low blood sugar? Low blood sugar is called hypoglycemia. This is when blood sugar is at or below 70 mg/dL (3.9 mmol/L). Symptoms may include:  Feeling: ? Hungry. ? Worried or nervous (anxious). ? Sweaty and clammy. ? Confused. ? Dizzy. ? Sleepy. ? Sick to your stomach (nauseous).  Having: ? A fast heartbeat. ? A headache. ? A change in your vision. ? Tingling or no feeling (numbness) around the mouth, lips, or tongue. ? Jerky movements that you cannot control (seizure).  Having trouble with: ? Moving (coordination). ? Sleeping. ? Passing out (fainting). ? Getting upset easily (irritability). Treating low blood sugar To treat low blood  sugar, eat or drink something sugary right away. If you can think clearly and swallow safely, follow the 15:15 rule:  Take 15 grams of a fast-acting carb (carbohydrate). Talk with your doctor about how much you should take.  Some fast-acting carbs are: ? Sugar tablets (glucose pills). Take 3-4 glucose pills. ? 6-8 pieces of hard candy. ? 4-6 oz (120-150 mL) of fruit juice. ? 4-6 oz (120-150 mL) of regular (not diet) soda. ? 1 Tbsp (15 mL) honey or sugar.    Check your blood sugar 15 minutes after you take the carb.  If your blood sugar is still at or below 70 mg/dL (3.9 mmol/L), take 15 grams of a carb again.  If your blood sugar does not go above 70 mg/dL (3.9 mmol/L) after 3 tries, get help right away.  After your blood sugar goes back to normal, eat a meal or a snack within 1 hour. Treating very low blood sugar If your blood sugar is at or below 54 mg/dL (3 mmol/L), you have very low blood sugar (severe hypoglycemia). This is an emergency. Do not wait to see if the symptoms will go away. Get medical help right away. Call your local emergency services (911 in the [place] Do not drive yourself to the hospital. Questions to ask your health care provider  Do I need to meet with a diabetes educator?  What equipment will I need to care for myself at home?  What diabetes medicines do I need? When should I take them?  How often do I need to check my blood sugar?  What number can I call if I have questions?  When is my next doctor's visit?  Where can I find a support group for people with diabetes? Where to find more information  American Diabetes Association: www.diabetes.org  American Association of Diabetes Educators: www.diabeteseducator.org/patient-resources Contact a doctor if:  Your blood sugar is at or above 240 mg/dL (13.3 mmol/L) for 2 days in a row.  You have been sick or have had a fever for 2 days or more, and you are not getting better.  You have any of these  problems for more than 6 hours: ? You cannot eat or drink. ? You feel sick to your stomach (nauseous). ? You throw up (vomit). ? You have watery poop (diarrhea). Get help right away if:  Your blood sugar is lower than 54 mg/dL (3 mmol/L).  You get confused.  You have trouble: ? Thinking clearly. ? Breathing. Summary  Diabetes (diabetes mellitus) is a long-term (chronic) disease. It occurs when the body does not properly use sugar (glucose) that is released from food after digestion.  Take insulin and diabetes medicines as told.  Check your blood sugar every day, as often as told.  Keep all follow-up visits as told by your doctor. This is important. This information is not intended to replace advice given to you by your health care provider. Make sure you discuss any questions you have with your health care provider. Document Released: 07/17/2017 Document Revised: 06/04/2018 Document Reviewed: 07/17/2017 Elsevier Patient Education  2020 [employer].   Hypertension, Adult Hypertension is another name for high blood pressure. High blood pressure forces your heart to work harder to pump blood. This can cause problems over time. There are two numbers in a blood pressure reading. There is a top number (systolic) over a bottom number (diastolic). It is best to have a blood pressure that is below 120/80. Healthy choices can help lower your blood pressure, or you may need medicine to help lower it. What are the causes? The cause of this condition is not known. Some conditions may be related to high blood pressure. What increases the risk?  Smoking.  Having type 2 diabetes mellitus, high cholesterol, or both.  Not getting enough exercise or physical activity.  Being overweight.  Having too much fat, sugar, calories, or salt (sodium) in your diet.  Drinking too much alcohol.  Having long-term (chronic) kidney disease.  Having a family history  of high blood pressure.  Age.  Risk increases with age.  Race. You may be at higher risk if you are African American.  Gender. Men are at higher risk than women before age 50. After age 50, women are at higher risk than men.  Having obstructive sleep apnea.  Stress. What are the signs or symptoms?  High blood pressure may not cause symptoms. Very high blood pressure (hypertensive crisis) may cause: ? Headache. ? Feelings of worry or nervousness (anxiety). ? Shortness of breath. ? Nosebleed. ? A feeling of being sick to your stomach (nausea). ? Throwing up (vomiting). ? Changes in how you see. ? Very bad chest pain. ? Seizures. How is this treated?  This condition is treated by making healthy lifestyle changes, such as: ? Eating healthy foods. ? Exercising more. ? Drinking less alcohol.  Your health care provider may prescribe medicine if lifestyle changes are not enough to get your blood pressure under control, and if: ? Your top number is above 130. ? Your bottom number is above 80.  Your personal target blood pressure may vary. Follow these instructions at home: Eating and drinking   If told, follow the DASH eating plan. To follow this plan: ? Fill one half of your plate at each meal with fruits and vegetables. ? Fill one fourth of your plate at each meal with whole grains. Whole grains include whole-wheat pasta, brown rice, and whole-grain bread. ? Eat or drink low-fat dairy products, such as skim milk or low-fat yogurt. ? Fill one fourth of your plate at each meal with low-fat (lean) proteins. Low-fat proteins include fish, chicken without skin, eggs, beans, and tofu. ? Avoid fatty meat, cured and processed meat, or chicken with skin. ? Avoid pre-made or processed food.  Eat less than 1,500 mg of salt each day.  Do not drink alcohol if: ? Your doctor tells you not to drink. ? You are pregnant, may be pregnant, or are planning to become pregnant.  If you drink alcohol: ? Limit how much you  use to:  0-1 drink a day for women.  0-2 drinks a day for men. ? Be aware of how much alcohol is in your drink. In the [place] one drink equals one 12 oz bottle of beer (355 mL), one 5 oz glass of wine (148 mL), or one 1 oz glass of hard liquor (44 mL). Lifestyle   Work with your doctor to stay at a healthy weight or to lose weight. Ask your doctor what the best weight is for you.  Get at least 30 minutes of exercise most days of the week. This may include walking, swimming, or biking.  Get at least 30 minutes of exercise that strengthens your muscles (resistance exercise) at least 3 days a week. This may include lifting weights or doing Pilates.  Do not use any products that contain nicotine or tobacco, such as cigarettes, e-cigarettes, and chewing tobacco. If you need help quitting, ask your doctor.  Check your blood pressure at home as told by your doctor.  Keep all follow-up visits as told by your doctor. This is important. Medicines  Take over-the-counter and prescription medicines only as told by your doctor. Follow directions carefully.  Do not skip doses of blood pressure medicine. The medicine does not work as well if you skip doses. Skipping doses also puts you at risk for problems.  Ask your doctor about side effects or reactions to medicines that you should watch for. Contact a  doctor if you:  Think you are having a reaction to the medicine you are taking.  Have headaches that keep coming back (recurring).  Feel dizzy.  Have swelling in your ankles.  Have trouble with your vision. Get help right away if you:  Get a very bad headache.  Start to feel mixed up (confused).  Feel weak or numb.  Feel faint.  Have very bad pain in your: ? Chest. ? Belly (abdomen).  Throw up more than once.  Have trouble breathing. Summary  Hypertension is another name for high blood pressure.  High blood pressure forces your heart to work harder to pump blood.  For  most people, a normal blood pressure is less than 120/80.  Making healthy choices can help lower blood pressure. If your blood pressure does not get lower with healthy choices, you may need to take medicine. This information is not intended to replace advice given to you by your health care provider. Make sure you discuss any questions you have with your health care provider. Document Released: 10/01/2007 Document Revised: 12/23/2017 Document Reviewed: 12/23/2017 Elsevier Patient Education  2020 Elsevier Inc.  COVID-19: How to Protect Yourself and Others Know how it spreads  There is currently no vaccine to prevent coronavirus disease 2019 (COVID-19).  The best way to prevent illness is to avoid being exposed to this virus.  The virus is thought to spread mainly from person-to-person. ? Between people who are in close contact with one another (within about 6 feet). ? Through respiratory droplets produced when an infected person coughs, sneezes or talks. ? These droplets can land in the mouths or noses of people who are nearby or possibly be inhaled into the lungs. ? Some recent studies have suggested that COVID-19 may be spread by people who are not showing symptoms. Everyone should Clean your hands often  Wash your hands often with soap and water for at least 20 seconds especially after you have been in a public place, or after blowing your nose, coughing, or sneezing.  If soap and water are not readily available, use a hand sanitizer that contains at least 60% alcohol. Cover all surfaces of your hands and rub them together until they feel dry.  Avoid touching your eyes, nose, and mouth with unwashed hands. Avoid close contact  Stay home if you are sick.  Avoid close contact with people who are sick.  Put distance between yourself and other people. ? Remember that some people without symptoms may be able to spread virus. ? This is especially important for people who are at higher  risk of getting very [employer].www.cdc.gov/coronavirus/2019-ncov/need-extra-precautions/people-at-higher-risk.html Cover your mouth and nose with a cloth face cover when around others  You could spread COVID-19 to others even if you do not feel sick.  Everyone should wear a cloth face cover when they have to go out in public, for example to the grocery store or to pick up other necessities. ? Cloth face coverings should not be placed on young children under age 48, anyone who has trouble breathing, or is unconscious, incapacitated or otherwise unable to remove the mask without assistance.  The cloth face cover is meant to protect other people in case you are infected.  Do NOT use a facemask meant for a Research scientist (physical sciences)healthcare worker.  Continue to keep about 6 feet between yourself and others. The cloth face cover is not a substitute for social distancing. Cover coughs and sneezes  If you are in a private setting and do  not have on your cloth face covering, remember to always cover your mouth and nose with a tissue when you cough or sneeze or use the inside of your elbow.  Throw used tissues in the trash.  Immediately wash your hands with soap and water for at least 20 seconds. If soap and water are not readily available, clean your hands with a hand sanitizer that contains at least 60% alcohol. Clean and disinfect  Clean AND disinfect frequently touched surfaces daily. This includes tables, doorknobs, light switches, countertops, handles, desks, phones, keyboards, toilets, faucets, and sinks. ktimeonline.comwww.cdc.gov/coronavirus/2019-ncov/prevent-getting-sick/disinfecting-your-home.html  If surfaces are dirty, clean them: Use detergent or soap and water prior to disinfection.  Then, use a household disinfectant. You can see a list of EPA-registered household disinfectants here. [place].co.uk[place]c.gov/coronavirus 08/31/2018 This information is not intended to replace advice given to you by your health care provider. Make sure you  discuss any questions you have with your health care provider. Document Released: 08/10/2018 Document Revised: 09/08/2018 Document Reviewed: 08/10/2018 Elsevier Patient Education  2020 [employer]

## 2018-11-18 NOTE — Progress Notes (Signed)
   Subjective:    Patient ID: Shelly Benjamin, female    DOB: 03-09-1971, 48 y.o.   MRN: [id number]  Patient presented to clinic with complaints of abdominal pain, diarrhea, red eyes, runny nose, chills, and mild body aches.   Diabetes She has type 2 diabetes mellitus.  Hypertension  Conjunctivitis  Associated symptoms include diarrhea.  Diarrhea       Review of Systems  Gastrointestinal: Positive for diarrhea.       Objective:   Physical Exam  Assessment & Plan:   Wt Readings from Last 3 Encounters:  08/06/18 291 lb (132 kg)  09/04/17 300 lb (136.1 kg)  08/27/17 300 lb (136.1 kg)   Temp Readings from Last 3 Encounters:  11/18/18 98 F (36.7 C) (Temporal)  08/06/18 98.1 F (36.7 C) (Oral)  09/04/17 98.6 F (37 C) (Oral)   BP Readings from Last 3 Encounters:  11/18/18 (!) 150/98  08/06/18 (!) 168/109  09/04/17 (!) 132/107   Pulse Readings from Last 3 Encounters:  11/18/18 98  08/06/18 (!) 102  09/04/17 (!) 113     No results found for: OMBTDH         Thank you!!  [facility] Nurse Specialist [place]: [phone]  Cell:  [phone] Website: [email or web address]

## 2018-11-21 LAB — NOVEL CORONAVIRUS, NAA: SARS-CoV-2, NAA: NOT DETECTED

## 2018-11-21 LAB — SPECIMEN STATUS REPORT

## 2018-11-22 ENCOUNTER — Telehealth: Payer: Self-pay

## 2018-11-22 NOTE — Telephone Encounter (Signed)
Patient presented to clinic with red eyes, abdominal pain, complaints of diarrhea, runny nose, headache, mild body aches and chills. No fever noted upon assessment.

## 2018-11-22 NOTE — Telephone Encounter (Signed)
Received return call from patient; updated with NEGATIVE COVID-19 results. No further symptoms noted at this time.

## 2018-11-22 NOTE — Telephone Encounter (Signed)
Left voicemail to return call to update patient about test results.

## 2019-01-12 ENCOUNTER — Emergency Department: Payer: No Typology Code available for payment source

## 2019-01-12 ENCOUNTER — Encounter: Payer: Self-pay | Admitting: Emergency Medicine

## 2019-01-12 ENCOUNTER — Other Ambulatory Visit: Payer: Self-pay

## 2019-01-12 DIAGNOSIS — Y929 Unspecified place or not applicable: Secondary | ICD-10-CM | POA: Insufficient documentation

## 2019-01-12 DIAGNOSIS — I10 Essential (primary) hypertension: Secondary | ICD-10-CM | POA: Diagnosis not present

## 2019-01-12 DIAGNOSIS — S4991XA Unspecified injury of right shoulder and upper arm, initial encounter: Secondary | ICD-10-CM | POA: Diagnosis present

## 2019-01-12 DIAGNOSIS — Z794 Long term (current) use of insulin: Secondary | ICD-10-CM | POA: Insufficient documentation

## 2019-01-12 DIAGNOSIS — Y939 Activity, unspecified: Secondary | ICD-10-CM | POA: Insufficient documentation

## 2019-01-12 DIAGNOSIS — W208XXA Other cause of strike by thrown, projected or falling object, initial encounter: Secondary | ICD-10-CM | POA: Insufficient documentation

## 2019-01-12 DIAGNOSIS — E119 Type 2 diabetes mellitus without complications: Secondary | ICD-10-CM | POA: Diagnosis not present

## 2019-01-12 DIAGNOSIS — Y990 Civilian activity done for income or pay: Secondary | ICD-10-CM | POA: Diagnosis not present

## 2019-01-12 DIAGNOSIS — S40022A Contusion of left upper arm, initial encounter: Secondary | ICD-10-CM | POA: Insufficient documentation

## 2019-01-12 DIAGNOSIS — Z79899 Other long term (current) drug therapy: Secondary | ICD-10-CM | POA: Diagnosis not present

## 2019-01-12 DIAGNOSIS — Z87891 Personal history of nicotine dependence: Secondary | ICD-10-CM | POA: Diagnosis not present
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
## 2019-01-13 ENCOUNTER — Emergency Department
Admission: EM | Admit: 2019-01-13 | Discharge: 2019-01-13 | Disposition: A | Discharge status: Home / Self Care | Payer: No Typology Code available for payment source | Attending: Emergency Medicine | Admitting: Emergency Medicine

## 2019-01-13 DIAGNOSIS — S40022A Contusion of left upper arm, initial encounter: Secondary | ICD-10-CM

## 2019-01-13 MED ORDER — TRAMADOL HCL 50 MG PO TABS
50.0000 mg | ORAL_TABLET | Freq: Once | ORAL | Status: AC
  Administered 2019-01-13: 50 mg via ORAL
  Filled 2019-01-13: qty 1

## 2019-01-13 NOTE — ED Provider Notes (Signed)
[facility] Emergency Department Provider Note   First MD Initiated Contact with Patient 01/13/19 0132     (approximate)  I have reviewed the triage vital signs and the nursing notes.   HISTORY  Chief Complaint Arm Injury   HPI Shelly Benjamin is a 48 y.o. 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  Patient states that current pain score is 8 out of 10 worse with palpation and movement of the arm.        Past Medical History:  Diagnosis Date  . Diabetes mellitus without complication ([place])   . GERD (gastroesophageal reflux disease)   . Hyperlipidemia   . Hypertension     There are no active problems to display for this patient.   Past Surgical History:  Procedure Laterality Date  . CHOLECYSTECTOMY    . TUBAL LIGATION      Prior to Admission medications   Medication Sig Start Date End Date Taking? Authorizing Provider  atorvastatin (LIPITOR) 40 MG tablet Take 1 tablet by mouth at bedtime. 08/21/17   [provider]  doxycycline (VIBRAMYCIN) 100 MG capsule Take 1 capsule (100 mg total) by mouth 2 (two) times daily. 08/06/18   Marylene Land, NP  hydrochlorothiazide (HYDRODIURIL) 25 MG tablet Take 25 mg by mouth daily.    [provider]  insulin NPH Human (HUMULIN N,NOVOLIN N) 100 UNIT/ML injection Inject 12 Units into the skin 2 (two) times daily before a meal.    [provider]  lisinopril (PRINIVIL,ZESTRIL) 10 MG tablet Take 10 mg by mouth daily.    [provider]  mupirocin ointment (BACTROBAN) 2 % Apply two times a day for 7 days. 08/06/18   Marylene Land, NP  omeprazole (PRILOSEC) 40 MG capsule Take 1 capsule by mouth daily. 08/21/17   [provider]    Allergies Morphine and related and Naproxen  Family History  Problem Relation Age of Onset   . Diabetes Mother   . Heart failure Mother   . Kidney failure Mother   . COPD Mother   . Hypertension Mother   . Other Father        "haw river syndrome"    Social History Social History   Tobacco Use  . Smoking status: Former Smoker    Packs/day: 0.50    Types: Cigarettes    Quit date: 05/07/2018    Years since quitting: 0.6  . Smokeless tobacco: Never Used  Substance Use Topics  . Alcohol use: No  . Drug use: Never    Review of Systems Constitutional: No fever/chills Eyes: No visual changes. ENT: No sore throat. Cardiovascular: Denies chest pain. Respiratory: Denies shortness of breath. Gastrointestinal: No abdominal pain.  No nausea, no vomiting.  No diarrhea.  No constipation. Genitourinary: Negative for dysuria. Musculoskeletal: Negative for neck pain.  Negative for back pain. Integumentary: Negative for rash. Neurological: Negative for headaches, focal weakness or numbness.   ____________________________________________   PHYSICAL EXAM:  VITAL SIGNS: ED Triage Vitals  Enc Vitals Group     BP 01/12/19 2332 (!) 110/59     Pulse Rate 01/12/19 2332 91     Resp 01/12/19 2332 17     Temp 01/12/19 2332 98.4 F (36.9 C)     Temp Source 01/12/19 2332 Oral     SpO2 01/12/19 2332 97 %  Weight 01/12/19 2331 (!) 137.9 kg (304 lb)     Height 01/12/19 2331 1.676 m (5\' 6" )     Head Circumference --      Peak Flow --      Pain Score 01/12/19 2331 9     Pain Loc --      Pain Edu? --      Excl. in GC? --     Constitutional: Alert and oriented.  Eyes: Conjunctivae are normal.  Head: Atraumatic. Mouth/Throat: Mucous membranes are moist. Neck: No stridor.  No meningeal signs.   Cardiovascular: Normal rate, regular rhythm. Good peripheral circulation. Grossly normal heart sounds. Respiratory: Normal respiratory effort.  No retractions. Gastrointestinal: Soft and nontender. No distention.  Musculoskeletal: Soft tissue swelling to the lateral portion of the left  arm.  Abrasion noted on the left arm Neurologic:  Normal speech and language. No gross focal neurologic deficits are appreciated.  Skin:  Skin is warm, dry and intact. Psychiatric: Mood and affect are normal. Speech and behavior are normal.   RADIOLOGY I, [place] Salia Cangemi, personally viewed and evaluated these images (plain radiographs) as part of my medical decision making, as well as reviewing the written report by the radiologist.  ED MD interpretation: Mild soft tissue swelling laterally no acute osseous abnormality  Official radiology report(s): Dg Humerus Left  Result Date: 01/13/2019 CLINICAL DATA:  Struck by Biomedical scientist EXAM: LEFT HUMERUS - 2+ VIEW COMPARISON:  Chest radiograph October 09, 2015 FINDINGS: Mild soft tissue swelling along the lateral aspect of arm. Left humerus is intact. Mild glenohumeral and acromioclavicular arthrosis. Enthesopathic changes noted on the lateral epicondyle. Soft tissues are unremarkable. No subcutaneous gas or foreign body. Included portion of the left chest wall is free of abnormality. Alignment at the shoulder and elbow is grossly maintained though incompletely evaluated on non dedicated radiographs. IMPRESSION: 1. Mild soft tissue swelling laterally. No acute osseous abnormality of the left humerus. 2. Mild glenohumeral and acromioclavicular arthrosis. Electronically Signed   By: Kreg Shropshire M.D.   On: 01/13/2019 00:00    ____________________________________________   PROCEDURES   Procedure(s) performed (including Critical Care):  Procedures   ____________________________________________   INITIAL IMPRESSION / MDM / ASSESSMENT AND PLAN / ED COURSE  As part of my medical decision making, I reviewed the following data within the electronic MEDICAL RECORD NUMBER 48 year old female presented with above-stated history and physical exam.  X-ray revealed no evidence of fracture or dislocation.  Clinical findings consistent with a contusion.   Patient given ibuprofen 800 mg and ice pack in the emergency department.  ____________________________________________  FINAL CLINICAL IMPRESSION(S) / ED DIAGNOSES  Final diagnoses:  Arm contusion, left, initial encounter     MEDICATIONS GIVEN DURING THIS VISIT:  Medications - No data to display   ED Discharge Orders    None      *Please note:  ABAGAYLE ROMANELLO was evaluated in Emergency Department on 01/13/2019 for the symptoms described in the history of present illness. She was evaluated in the context of the global COVID-19 pandemic, which necessitated consideration that the patient might be at risk for infection with the SARS-CoV-2 virus that causes COVID-19. Institutional protocols and algorithms that pertain to the evaluation of patients at risk for COVID-19 are in a state of rapid change based on information released by regulatory bodies including the CDC and federal and state organizations. These policies and algorithms were followed during the patient's care in the ED.  Some ED evaluations and interventions  may be delayed as a result of limited staffing during the pandemic.*  Note:  This document was prepared using Dragon voice recognition software and may include unintentional dictation errors.   Darci CurrentBrown, [place] N, MD 01/13/19 [phone]

## 2019-05-25 ENCOUNTER — Ambulatory Visit: Payer: BC Managed Care – PPO | Attending: Internal Medicine

## 2019-05-25 DIAGNOSIS — Z20822 Contact with and (suspected) exposure to covid-19: Secondary | ICD-10-CM

## 2019-05-26 ENCOUNTER — Emergency Department: Payer: BC Managed Care – PPO

## 2019-05-26 ENCOUNTER — Other Ambulatory Visit: Payer: Self-pay

## 2019-05-26 ENCOUNTER — Encounter: Payer: Self-pay | Admitting: Emergency Medicine

## 2019-05-26 ENCOUNTER — Emergency Department
Admission: EM | Admit: 2019-05-26 | Discharge: 2019-05-26 | Disposition: A | Discharge status: Home / Self Care | Payer: BC Managed Care – PPO | Attending: Student | Admitting: Student

## 2019-05-26 DIAGNOSIS — R0789 Other chest pain: Secondary | ICD-10-CM | POA: Insufficient documentation

## 2019-05-26 DIAGNOSIS — R110 Nausea: Secondary | ICD-10-CM | POA: Diagnosis not present

## 2019-05-26 DIAGNOSIS — R0602 Shortness of breath: Secondary | ICD-10-CM | POA: Diagnosis not present

## 2019-05-26 DIAGNOSIS — Z794 Long term (current) use of insulin: Secondary | ICD-10-CM | POA: Insufficient documentation

## 2019-05-26 DIAGNOSIS — K219 Gastro-esophageal reflux disease without esophagitis: Secondary | ICD-10-CM | POA: Insufficient documentation

## 2019-05-26 DIAGNOSIS — I10 Essential (primary) hypertension: Secondary | ICD-10-CM | POA: Diagnosis not present

## 2019-05-26 DIAGNOSIS — Z79899 Other long term (current) drug therapy: Secondary | ICD-10-CM | POA: Diagnosis not present

## 2019-05-26 DIAGNOSIS — R079 Chest pain, unspecified: Secondary | ICD-10-CM

## 2019-05-26 DIAGNOSIS — F17211 Nicotine dependence, cigarettes, in remission: Secondary | ICD-10-CM | POA: Insufficient documentation

## 2019-05-26 DIAGNOSIS — E119 Type 2 diabetes mellitus without complications: Secondary | ICD-10-CM | POA: Diagnosis not present

## 2019-05-26 LAB — CBC WITH DIFFERENTIAL/PLATELET
Abs Immature Granulocytes: 0.03 10*3/uL (ref 0.00–0.07)
Basophils Absolute: 0.1 10*3/uL (ref 0.0–0.1)
Basophils Relative: 1 %
Eosinophils Absolute: 0.2 10*3/uL (ref 0.0–0.5)
Eosinophils Relative: 2 %
HCT: 32.7 % — ABNORMAL LOW (ref 36.0–46.0)
Hemoglobin: 10.1 g/dL — ABNORMAL LOW (ref 12.0–15.0)
Immature Granulocytes: 0 %
Lymphocytes Relative: 35 %
Lymphs Abs: 3.2 10*3/uL (ref 0.7–4.0)
MCH: 24.2 pg — ABNORMAL LOW (ref 26.0–34.0)
MCHC: 30.9 g/dL (ref 30.0–36.0)
MCV: 78.2 fL — ABNORMAL LOW (ref 80.0–100.0)
Monocytes Absolute: 0.6 10*3/uL (ref 0.1–1.0)
Monocytes Relative: 7 %
Neutro Abs: 5.1 10*3/uL (ref 1.7–7.7)
Neutrophils Relative %: 55 %
Platelets: 264 10*3/uL (ref 150–400)
RBC: 4.18 MIL/uL (ref 3.87–5.11)
RDW: 15.3 % (ref 11.5–15.5)
WBC: 9.1 10*3/uL (ref 4.0–10.5)
nRBC: 0 % (ref 0.0–0.2)

## 2019-05-26 LAB — COMPREHENSIVE METABOLIC PANEL
ALT: 12 U/L (ref 0–44)
AST: 12 U/L — ABNORMAL LOW (ref 15–41)
Albumin: 3.3 g/dL — ABNORMAL LOW (ref 3.5–5.0)
Alkaline Phosphatase: 74 U/L (ref 38–126)
Anion gap: 7 (ref 5–15)
BUN: 23 mg/dL — ABNORMAL HIGH (ref 6–20)
CO2: 25 mmol/L (ref 22–32)
Calcium: 8.9 mg/dL (ref 8.9–10.3)
Chloride: 101 mmol/L (ref 98–111)
Creatinine, Ser: 1.62 mg/dL — ABNORMAL HIGH (ref 0.44–1.00)
GFR calc Af Amer: 42 mL/min — ABNORMAL LOW (ref >60)
GFR calc non Af Amer: 37 mL/min — ABNORMAL LOW (ref >60)
Glucose, Bld: 204 mg/dL — ABNORMAL HIGH (ref 70–99)
Potassium: 4.2 mmol/L (ref 3.5–5.1)
Sodium: 133 mmol/L — ABNORMAL LOW (ref 135–145)
Total Bilirubin: 0.5 mg/dL (ref 0.3–1.2)
Total Protein: 7.8 g/dL (ref 6.5–8.1)

## 2019-05-26 LAB — TROPONIN I (HIGH SENSITIVITY)
Troponin I (High Sensitivity): <2 ng/L (ref <18)
Troponin I (High Sensitivity): 4 ng/L (ref <18)

## 2019-05-26 LAB — LIPASE, BLOOD: Lipase: 102 U/L — ABNORMAL HIGH (ref 11–51)

## 2019-05-26 MED ORDER — SODIUM CHLORIDE 0.9 % IV BOLUS
500.0000 mL | Freq: Once | INTRAVENOUS | Status: AC
  Administered 2019-05-26: 500 mL via INTRAVENOUS

## 2019-05-26 NOTE — ED Triage Notes (Signed)
Pt arrival via [facility] from home due to chest pain. EMS states that she began having chest pain an hour ago that radiates towards her shoulder blades.  Pt took 324 of ASA at home and EMS gave her 1 of nitro in route. Pt was diagnosed with pancreatitis 3 weeks ago. Pt's pain started as a 4 out of 10, but went to a 7 out of 10 after the nitro.

## 2019-05-26 NOTE — Discharge Instructions (Addendum)
Thank you for letting us take care of you in the emergency department today.   Please continue to take any regular, prescribed medications.   Please follow up with: - Your primary care doctor to review your ER visit and follow up on your symptoms.  - Cardiology doctor, information/referral below  Please return to the ER for any new or worsening symptoms.

## 2019-05-26 NOTE — ED Notes (Signed)
Pt EKG delayed due to baby oil being on patient so the stickers were not sticking, pt then transported to xray while grabbing supplies for EKG. Dr. Truitt Merle.

## 2019-05-26 NOTE — ED Notes (Signed)
Pt signed dischrage papers and papers sent to records.

## 2019-05-26 NOTE — ED Provider Notes (Signed)
[facility] Emergency Department Provider Note  ____________________________________________   First MD Initiated Contact with Patient 05/26/19 1751     (approximate)  I have reviewed the triage vital signs and the nursing notes.  History  Chief Complaint Chest Pain    HPI Shelly Benjamin is a 49 y.o. female with history of HTN, HLD, DM, tobacco use, GERD, recent diagnosis pancreatitis who presents to the emergency department for chest pain.  Patient states chest pain started just prior to arrival, without any identifiable inciting trigger.  Central in location, radiates somewhat up to the right shoulder area and back.  Described as sharp, no alleviating/aggravating components.  Patient took 324 mg ASA prior to arrival.  She reports her pain is improved compared to onset and is more so dull now.  She reports some mild shortness of breath.  Also reports some nausea, but this has been ongoing since being diagnosed with pancreatitis a few weeks ago.  She denies any known CAD.  No history of VTE.   Past Medical Hx Past Medical History:  Diagnosis Date  . Diabetes mellitus without complication ([place])   . GERD (gastroesophageal reflux disease)   . Hyperlipidemia   . Hypertension     Problem List There are no problems to display for this patient.   Past Surgical Hx Past Surgical History:  Procedure Laterality Date  . CHOLECYSTECTOMY    . TUBAL LIGATION      Medications Prior to Admission medications   Medication Sig Start Date End Date Taking? Authorizing Provider  atorvastatin (LIPITOR) 40 MG tablet Take 1 tablet by mouth at bedtime. 08/21/17   [provider]  doxycycline (VIBRAMYCIN) 100 MG capsule Take 1 capsule (100 mg total) by mouth 2 (two) times daily. 08/06/18   Marylene Land, NP  hydrochlorothiazide (HYDRODIURIL) 25 MG tablet Take 25 mg by mouth daily.    [provider]  insulin NPH Human (HUMULIN N,NOVOLIN N) 100 UNIT/ML  injection Inject 12 Units into the skin 2 (two) times daily before a meal.    [provider]  lisinopril (PRINIVIL,ZESTRIL) 10 MG tablet Take 10 mg by mouth daily.    [provider]  mupirocin ointment (BACTROBAN) 2 % Apply two times a day for 7 days. 08/06/18   Marylene Land, NP  omeprazole (PRILOSEC) 40 MG capsule Take 1 capsule by mouth daily. 08/21/17   [provider]    Allergies Morphine and related and Naproxen  Family Hx Family History  Problem Relation Age of Onset  . Diabetes Mother   . Heart failure Mother   . Kidney failure Mother   . COPD Mother   . Hypertension Mother   . Other Father        "haw river syndrome"    Social Hx Social History   Tobacco Use  . Smoking status: Former Smoker    Packs/day: 0.50    Types: Cigarettes    Quit date: 05/07/2018    Years since quitting: 1.0  . Smokeless tobacco: Never Used  Substance Use Topics  . Alcohol use: No  . Drug use: Never     Review of Systems  Constitutional: Negative for fever, chills. Eyes: Negative for visual changes. ENT: Negative for sore throat. Cardiovascular: + for chest pain. Respiratory: + for shortness of breath. Gastrointestinal: + for nausea.  Genitourinary: Negative for dysuria. Musculoskeletal: Negative for leg swelling. Skin: Negative for rash. Neurological: Negative for headaches.   Physical Exam  Vital Signs: ED Triage  Vitals  Enc Vitals Group     BP 05/26/19 1749 (!) 158/104     Pulse Rate 05/26/19 1749 71     Resp 05/26/19 1749 18     Temp 05/26/19 1749 98.3 F (36.8 C)     Temp Source 05/26/19 1749 Oral     SpO2 05/26/19 1749 99 %     Weight 05/26/19 1801 (!) 304 lb 3.8 oz (138 kg)     Height 05/26/19 1801 5\' 4"  (1.626 m)     Head Circumference --      Peak Flow --      Pain Score 05/26/19 1755 5     Pain Loc --      Pain Edu? --      Excl. in GC? --     Constitutional: Alert and oriented. 138 kg.  Head: Normocephalic.  Atraumatic. Eyes: Conjunctivae clear. Sclera anicteric. Nose: No congestion. No rhinorrhea. Mouth/Throat: Wearing mask.  Neck: No stridor.   Cardiovascular: Normal rate, regular rhythm. Extremities well perfused. Respiratory: Normal respiratory effort.  Lungs CTAB. Gastrointestinal: Soft. Non-tender. Non-distended.  Musculoskeletal: No lower extremity edema. No deformities. Neurologic:  Normal speech and language. No gross focal neurologic deficits are appreciated.  Skin: Skin is warm, dry and intact. No rash noted. Psychiatric: Mood and affect are appropriate for situation.  EKG  Personally reviewed.   Rate: 73 Rhythm: sinus Axis: normal Intervals: WNL No acute ischemic changes No STEMI    Radiology  CXR: IMPRESSION:  No active cardiopulmonary disease.    Procedures  Procedure(s) performed (including critical care):  Procedures   Initial Impression / Assessment and Plan / ED Course  49 y.o. female who presents to the ED for chest pain, as above  Ddx: ACS, GERD, pancreatitis, pleurisy. Doubt PE - no tachycardia, tachypnea, hypoxia, leg swelling, recent immobilization, or hx of VTE.   Will obtain labs, EKG, imaging.  Patient has received full dose ASA prior to arrival.  Troponin x 2 negative, EKG w/o acute ischemic changes. Lipase minimally elevated. Chemistry reveals mild dehydration/AKI (Cr 1.62, Na 133), receiving IVF. Patient continues to feel well. As such, she is stable for discharge w/ outpatient follow up. Given information/referral to cardiology and advised follow up. Given return precautions. Patient agreeable w/ plan.    Final Clinical Impression(s) / ED Diagnosis  Final diagnoses:  Chest pain in adult       Note:  This document was prepared using Dragon voice recognition software and may include unintentional dictation errors.   44., MD 05/27/19 [phone]

## 2019-05-27 ENCOUNTER — Telehealth: Payer: Self-pay

## 2019-05-27 LAB — NOVEL CORONAVIRUS, NAA: SARS-CoV-2, NAA: NOT DETECTED

## 2019-05-27 NOTE — Telephone Encounter (Signed)
Called to inquire about COVID-19 results; still pending at this time. States that she went to the ER 05/26/19 for chest pain and that it radiates to her back and she was discharged but continues to have chest pain this morning. I advised her she should follow up or call 911 if her chest pain persisted.

## 2019-08-12 ENCOUNTER — Telehealth: Payer: Self-pay

## 2019-08-12 NOTE — Telephone Encounter (Signed)
Reiterated the requirements to return to work

## 2019-08-25 ENCOUNTER — Emergency Department: Payer: BC Managed Care – PPO

## 2019-08-25 ENCOUNTER — Inpatient Hospital Stay
Admission: EM | Admit: 2019-08-25 | Discharge: 2019-08-28 | DRG: 071 | Disposition: A | Discharge status: Home / Self Care | Payer: BC Managed Care – PPO | Attending: Internal Medicine | Admitting: Internal Medicine

## 2019-08-25 DIAGNOSIS — E785 Hyperlipidemia, unspecified: Secondary | ICD-10-CM | POA: Diagnosis present

## 2019-08-25 DIAGNOSIS — W19XXXA Unspecified fall, initial encounter: Secondary | ICD-10-CM | POA: Diagnosis present

## 2019-08-25 DIAGNOSIS — Z87891 Personal history of nicotine dependence: Secondary | ICD-10-CM

## 2019-08-25 DIAGNOSIS — R197 Diarrhea, unspecified: Secondary | ICD-10-CM

## 2019-08-25 DIAGNOSIS — B948 Sequelae of other specified infectious and parasitic diseases: Secondary | ICD-10-CM

## 2019-08-25 DIAGNOSIS — I129 Hypertensive chronic kidney disease with stage 1 through stage 4 chronic kidney disease, or unspecified chronic kidney disease: Secondary | ICD-10-CM | POA: Diagnosis present

## 2019-08-25 DIAGNOSIS — R531 Weakness: Secondary | ICD-10-CM

## 2019-08-25 DIAGNOSIS — I10 Essential (primary) hypertension: Secondary | ICD-10-CM

## 2019-08-25 DIAGNOSIS — R519 Headache, unspecified: Secondary | ICD-10-CM | POA: Diagnosis present

## 2019-08-25 DIAGNOSIS — R4182 Altered mental status, unspecified: Secondary | ICD-10-CM | POA: Diagnosis present

## 2019-08-25 DIAGNOSIS — G8929 Other chronic pain: Secondary | ICD-10-CM | POA: Diagnosis present

## 2019-08-25 DIAGNOSIS — U071 COVID-19: Secondary | ICD-10-CM

## 2019-08-25 DIAGNOSIS — Z8616 Personal history of COVID-19: Secondary | ICD-10-CM

## 2019-08-25 DIAGNOSIS — D509 Iron deficiency anemia, unspecified: Secondary | ICD-10-CM

## 2019-08-25 DIAGNOSIS — Z825 Family history of asthma and other chronic lower respiratory diseases: Secondary | ICD-10-CM

## 2019-08-25 DIAGNOSIS — G9341 Metabolic encephalopathy: Secondary | ICD-10-CM | POA: Diagnosis not present

## 2019-08-25 DIAGNOSIS — Z841 Family history of disorders of kidney and ureter: Secondary | ICD-10-CM

## 2019-08-25 DIAGNOSIS — E1169 Type 2 diabetes mellitus with other specified complication: Secondary | ICD-10-CM

## 2019-08-25 DIAGNOSIS — K529 Noninfective gastroenteritis and colitis, unspecified: Secondary | ICD-10-CM

## 2019-08-25 DIAGNOSIS — N179 Acute kidney failure, unspecified: Secondary | ICD-10-CM | POA: Diagnosis not present

## 2019-08-25 DIAGNOSIS — R1110 Vomiting, unspecified: Secondary | ICD-10-CM

## 2019-08-25 DIAGNOSIS — Z833 Family history of diabetes mellitus: Secondary | ICD-10-CM

## 2019-08-25 DIAGNOSIS — Y92009 Unspecified place in unspecified non-institutional (private) residence as the place of occurrence of the external cause: Secondary | ICD-10-CM

## 2019-08-25 DIAGNOSIS — Z9049 Acquired absence of other specified parts of digestive tract: Secondary | ICD-10-CM

## 2019-08-25 DIAGNOSIS — Z8249 Family history of ischemic heart disease and other diseases of the circulatory system: Secondary | ICD-10-CM

## 2019-08-25 DIAGNOSIS — G9349 Other encephalopathy: Secondary | ICD-10-CM

## 2019-08-25 DIAGNOSIS — N189 Chronic kidney disease, unspecified: Secondary | ICD-10-CM

## 2019-08-25 DIAGNOSIS — K219 Gastro-esophageal reflux disease without esophagitis: Secondary | ICD-10-CM | POA: Diagnosis present

## 2019-08-25 DIAGNOSIS — Z794 Long term (current) use of insulin: Secondary | ICD-10-CM

## 2019-08-25 DIAGNOSIS — N1832 Chronic kidney disease, stage 3b: Secondary | ICD-10-CM | POA: Diagnosis present

## 2019-08-25 DIAGNOSIS — Z6841 Body Mass Index (BMI) 40.0 and over, adult: Secondary | ICD-10-CM

## 2019-08-25 DIAGNOSIS — Z886 Allergy status to analgesic agent status: Secondary | ICD-10-CM

## 2019-08-25 DIAGNOSIS — R1010 Upper abdominal pain, unspecified: Secondary | ICD-10-CM

## 2019-08-25 DIAGNOSIS — E1122 Type 2 diabetes mellitus with diabetic chronic kidney disease: Secondary | ICD-10-CM | POA: Diagnosis present

## 2019-08-25 DIAGNOSIS — R1084 Generalized abdominal pain: Secondary | ICD-10-CM

## 2019-08-25 DIAGNOSIS — I639 Cerebral infarction, unspecified: Secondary | ICD-10-CM

## 2019-08-25 DIAGNOSIS — Z79899 Other long term (current) drug therapy: Secondary | ICD-10-CM

## 2019-08-25 DIAGNOSIS — N183 Chronic kidney disease, stage 3 unspecified: Secondary | ICD-10-CM

## 2019-08-25 LAB — CBC WITH DIFFERENTIAL/PLATELET
Abs Immature Granulocytes: 0.01 10*3/uL (ref 0.00–0.07)
Basophils Absolute: 0.1 10*3/uL (ref 0.0–0.1)
Basophils Relative: 1 %
Eosinophils Absolute: 0.2 10*3/uL (ref 0.0–0.5)
Eosinophils Relative: 2 %
HCT: 29.7 % — ABNORMAL LOW (ref 36.0–46.0)
Hemoglobin: 9.5 g/dL — ABNORMAL LOW (ref 12.0–15.0)
Immature Granulocytes: 0 %
Lymphocytes Relative: 32 %
Lymphs Abs: 3 10*3/uL (ref 0.7–4.0)
MCH: 25.1 pg — ABNORMAL LOW (ref 26.0–34.0)
MCHC: 32 g/dL (ref 30.0–36.0)
MCV: 78.6 fL — ABNORMAL LOW (ref 80.0–100.0)
Monocytes Absolute: 0.6 10*3/uL (ref 0.1–1.0)
Monocytes Relative: 7 %
Neutro Abs: 5.4 10*3/uL (ref 1.7–7.7)
Neutrophils Relative %: 58 %
Platelets: 299 10*3/uL (ref 150–400)
RBC: 3.78 MIL/uL — ABNORMAL LOW (ref 3.87–5.11)
RDW: 16.7 % — ABNORMAL HIGH (ref 11.5–15.5)
WBC: 9.3 10*3/uL (ref 4.0–10.5)
nRBC: 0 % (ref 0.0–0.2)

## 2019-08-25 LAB — BLOOD GAS, VENOUS
Acid-Base Excess: 0.7 mmol/L (ref 0.0–2.0)
Bicarbonate: 25.4 mmol/L (ref 20.0–28.0)
O2 Saturation: 53.6 %
Patient temperature: 37
pCO2, Ven: 40 mmHg — ABNORMAL LOW (ref 44.0–60.0)
pH, Ven: 7.41 (ref 7.250–7.430)
pO2, Ven: <31 mmHg — CL (ref 32.0–45.0)

## 2019-08-25 LAB — COMPREHENSIVE METABOLIC PANEL
ALT: 12 U/L (ref 0–44)
AST: 12 U/L — ABNORMAL LOW (ref 15–41)
Albumin: 3.2 g/dL — ABNORMAL LOW (ref 3.5–5.0)
Alkaline Phosphatase: 75 U/L (ref 38–126)
Anion gap: 9 (ref 5–15)
BUN: 24 mg/dL — ABNORMAL HIGH (ref 6–20)
CO2: 23 mmol/L (ref 22–32)
Calcium: 8.9 mg/dL (ref 8.9–10.3)
Chloride: 105 mmol/L (ref 98–111)
Creatinine, Ser: 1.57 mg/dL — ABNORMAL HIGH (ref 0.44–1.00)
GFR calc Af Amer: 44 mL/min — ABNORMAL LOW (ref >60)
GFR calc non Af Amer: 38 mL/min — ABNORMAL LOW (ref >60)
Glucose, Bld: 172 mg/dL — ABNORMAL HIGH (ref 70–99)
Potassium: 3.9 mmol/L (ref 3.5–5.1)
Sodium: 137 mmol/L (ref 135–145)
Total Bilirubin: 0.6 mg/dL (ref 0.3–1.2)
Total Protein: 7.6 g/dL (ref 6.5–8.1)

## 2019-08-25 LAB — URINE DRUG SCREEN, QUALITATIVE (ARMC ONLY)
Amphetamines, Ur Screen: NOT DETECTED
Barbiturates, Ur Screen: NOT DETECTED
Benzodiazepine, Ur Scrn: NOT DETECTED
Cannabinoid 50 Ng, Ur ~~LOC~~: NOT DETECTED
Cocaine Metabolite,Ur ~~LOC~~: NOT DETECTED
MDMA (Ecstasy)Ur Screen: NOT DETECTED
Methadone Scn, Ur: NOT DETECTED
Opiate, Ur Screen: NOT DETECTED
Phencyclidine (PCP) Ur S: NOT DETECTED
Tricyclic, Ur Screen: NOT DETECTED

## 2019-08-25 LAB — ETHANOL: Alcohol, Ethyl (B): <10 mg/dL (ref <10)

## 2019-08-25 LAB — URINALYSIS, COMPLETE (UACMP) WITH MICROSCOPIC
Bacteria, UA: NONE SEEN
Bilirubin Urine: NEGATIVE
Glucose, UA: NEGATIVE mg/dL
Hgb urine dipstick: NEGATIVE
Ketones, ur: NEGATIVE mg/dL
Leukocytes,Ua: NEGATIVE
Nitrite: NEGATIVE
Protein, ur: 30 mg/dL — AB
Specific Gravity, Urine: 1.023 (ref 1.005–1.030)
pH: 7 (ref 5.0–8.0)

## 2019-08-25 LAB — SAMPLE TO BLOOD BANK

## 2019-08-25 LAB — LIPASE, BLOOD: Lipase: 43 U/L (ref 11–51)

## 2019-08-25 MED ORDER — IOHEXOL 300 MG/ML  SOLN
100.0000 mL | Freq: Once | INTRAMUSCULAR | Status: AC | PRN
  Administered 2019-08-25: 100 mL via INTRAVENOUS

## 2019-08-25 MED ORDER — INSULIN ASPART 100 UNIT/ML ~~LOC~~ SOLN
0.0000 [IU] | Freq: Three times a day (TID) | SUBCUTANEOUS | Status: DC
  Administered 2019-08-26 (×2): 3 [IU] via SUBCUTANEOUS
  Administered 2019-08-27: 12:00:00 4 [IU] via SUBCUTANEOUS
  Administered 2019-08-27: 08:00:00 3 [IU] via SUBCUTANEOUS
  Filled 2019-08-25 (×4): qty 1

## 2019-08-25 MED ORDER — ONDANSETRON HCL 4 MG/2ML IJ SOLN
4.0000 mg | Freq: Four times a day (QID) | INTRAMUSCULAR | Status: DC | PRN
  Administered 2019-08-25: 23:00:00 4 mg via INTRAVENOUS
  Filled 2019-08-25: qty 2

## 2019-08-25 MED ORDER — INSULIN ASPART 100 UNIT/ML ~~LOC~~ SOLN: 0.0000 [IU] | Freq: Every day | SUBCUTANEOUS | Status: DC

## 2019-08-25 MED ORDER — SODIUM CHLORIDE 0.9 % IV SOLN: INTRAVENOUS | Status: DC

## 2019-08-25 MED ORDER — ACETAMINOPHEN 650 MG RE SUPP: 650.0000 mg | Freq: Four times a day (QID) | RECTAL | Status: DC | PRN

## 2019-08-25 MED ORDER — SODIUM CHLORIDE 0.9% FLUSH
3.0000 mL | Freq: Two times a day (BID) | INTRAVENOUS | Status: DC
  Administered 2019-08-26 – 2019-08-28 (×4): 3 mL via INTRAVENOUS

## 2019-08-25 MED ORDER — HYDROCODONE-ACETAMINOPHEN 5-325 MG PO TABS
1.0000 | ORAL_TABLET | ORAL | Status: DC | PRN
  Administered 2019-08-26: 1 via ORAL
  Administered 2019-08-27: 2 via ORAL
  Filled 2019-08-25: qty 2
  Filled 2019-08-25: qty 1

## 2019-08-25 MED ORDER — ACETAMINOPHEN 325 MG PO TABS
650.0000 mg | ORAL_TABLET | Freq: Four times a day (QID) | ORAL | Status: DC | PRN
  Administered 2019-08-26 – 2019-08-28 (×2): 650 mg via ORAL
  Filled 2019-08-25 (×2): qty 2

## 2019-08-25 MED ORDER — ONDANSETRON HCL 4 MG PO TABS: 4.0000 mg | ORAL_TABLET | Freq: Four times a day (QID) | ORAL | Status: DC | PRN

## 2019-08-25 MED ORDER — LACTATED RINGERS IV BOLUS
1000.0000 mL | Freq: Once | INTRAVENOUS | Status: AC
  Administered 2019-08-25: 1000 mL via INTRAVENOUS

## 2019-08-25 MED ORDER — ENOXAPARIN SODIUM 40 MG/0.4ML ~~LOC~~ SOLN
40.0000 mg | Freq: Two times a day (BID) | SUBCUTANEOUS | Status: DC
  Administered 2019-08-26 – 2019-08-28 (×5): 40 mg via SUBCUTANEOUS
  Filled 2019-08-25 (×5): qty 0.4

## 2019-08-25 NOTE — ED Notes (Signed)
Pt had mild right deviation to gaze. Occasionally will nod or shake head to answer questions.   Per family, pt began passing out and falling repeatedly. All symptoms began today per family

## 2019-08-25 NOTE — H&P (Signed)
History and Physical    Shelly Benjamin KWI:[id number] DOB: Jan 26, 1971 DOA: 08/25/2019  PCP: System, Pcp Not In   Patient coming from: Home  I have personally briefly reviewed patient's old medical records in [place]  Chief Complaint: Altered mental status  HPI: Shelly Benjamin is a 49 y.o. female with medical history significant for diabetes, hypertension obesity, COVID-19 positive on 08/09/2019, who started having vomiting and diarrhea with abdominal pain a couple days ago and was brought into the emergency room for evaluation of altered mental status.  Apparently her sister spoke with her earlier in the day and she was mentating well however later in the afternoon she complained of being tired and was unable to move about as her usual.  She also appeared confused.  ED Course: In the emergency room her vitals were within normal limits.  WBC on hemoglobin unremarkable, creatinine 1.59 which is her baseline, lipase 43.  She had a CT of the head and abdomen which showed no acute findings.  Due to lethargy and altered mental status observation requested. At the time of my relation patient was more alert Review of Systems: As per HPI otherwise 10 point review of systems negative.    Past Medical History:  Diagnosis Date   Diabetes mellitus without complication (HCC)    GERD (gastroesophageal reflux disease)    Hyperlipidemia    Hypertension     Past Surgical History:  Procedure Laterality Date   CHOLECYSTECTOMY     TUBAL LIGATION       reports that she quit smoking about 15 months ago. Her smoking use included cigarettes. She smoked 0.50 packs per day. She has never used smokeless tobacco. She reports that she does not drink alcohol or use drugs.  Allergies  Allergen Reactions   Morphine And Related Itching   Naproxen Swelling    Family History  Problem Relation Age of Onset   Diabetes Mother    Heart failure Mother    Kidney failure Mother    COPD Mother     Hypertension Mother    Other Father        "haw river syndrome"     Prior to Admission medications   Medication Sig Start Date End Date Taking? Authorizing Provider  atorvastatin (LIPITOR) 40 MG tablet Take 1 tablet by mouth at bedtime. 08/21/17   [provider]  doxycycline (VIBRAMYCIN) 100 MG capsule Take 1 capsule (100 mg total) by mouth 2 (two) times daily. 08/06/18   Marylene Land, NP  hydrochlorothiazide (HYDRODIURIL) 25 MG tablet Take 25 mg by mouth daily.    [provider]  insulin NPH Human (HUMULIN N,NOVOLIN N) 100 UNIT/ML injection Inject 12 Units into the skin 2 (two) times daily before a meal.    [provider]  lisinopril (PRINIVIL,ZESTRIL) 10 MG tablet Take 10 mg by mouth daily.    [provider]  mupirocin ointment (BACTROBAN) 2 % Apply two times a day for 7 days. 08/06/18   Marylene Land, NP  omeprazole (PRILOSEC) 40 MG capsule Take 1 capsule by mouth daily. 08/21/17   [provider]    Physical Exam: Vitals:   08/25/19 2004 08/25/19 2005 08/25/19 2130 08/25/19 2200  BP: (!) 158/96  (!) 171/98 (!) 177/100  Pulse: 84  77 69  Resp: 16  (!) 0 11  Temp: 98.1 F (36.7 C)     TempSrc: Oral     SpO2: 99%  98% 100%  Weight:  (!) 138 kg  Height:  5\' 4"  (1.626 m)       Vitals:   08/25/19 2004 08/25/19 2005 08/25/19 2130 08/25/19 2200  BP: (!) 158/96  (!) 171/98 (!) 177/100  Pulse: 84  77 69  Resp: 16  (!) 0 11  Temp: 98.1 F (36.7 C)     TempSrc: Oral     SpO2: 99%  98% 100%  Weight:  (!) 138 kg    Height:  5\' 4"  (1.626 m)      Constitutional: Alert and awake, oriented x3, not in any acute distress. Eyes: PERLA, EOMI, irises appear normal, anicteric sclera,  ENMT: external ears and nose appear normal, normal hearing             Lips appears normal, oropharynx mucosa, tongue, posterior pharynx appear normal  Neck: neck appears normal, no masses, normal ROM, no thyromegaly, no JVD  CVS: S1-S2 clear, no  murmur rubs or gallops,  , no carotid bruits, pedal pulses palpable, No LE edema Respiratory:  clear to auscultation bilaterally, no wheezing, rales or rhonchi. Respiratory effort normal. No accessory muscle use.  Abdomen: soft nontender, nondistended, normal bowel sounds, no hepatosplenomegaly, no hernias Musculoskeletal: : no cyanosis, clubbing , no contractures or atrophy Neuro: Cranial nerves II-XII intact, sensation, reflexes normal, strength Psych: judgement and insight appear normal, stable mood and affect,  Skin: no rashes or lesions or ulcers, no induration or nodules   Labs on Admission: I have personally reviewed following labs and imaging studies  CBC: Recent Labs  Lab 08/25/19 2018  WBC 9.3  NEUTROABS 5.4  HGB 9.5*  HCT 29.7*  MCV 78.6*  PLT 299   Basic Metabolic Panel: Recent Labs  Lab 08/25/19 2018  NA 137  K 3.9  CL 105  CO2 23  GLUCOSE 172*  BUN 24*  CREATININE 1.57*  CALCIUM 8.9   GFR: Estimated Creatinine Clearance: 59.6 mL/min (A) (by C-G formula based on SCr of 1.57 mg/dL (H)). Liver Function Tests: Recent Labs  Lab 08/25/19 2018  AST 12*  ALT 12  ALKPHOS 75  BILITOT 0.6  PROT 7.6  ALBUMIN 3.2*   Recent Labs  Lab 08/25/19 2018  LIPASE 43   No results for input(s): AMMONIA in the last 168 hours. Coagulation Profile: No results for input(s): INR, PROTIME in the last 168 hours. Cardiac Enzymes: No results for input(s): CKTOTAL, CKMB, CKMBINDEX, TROPONINI in the last 168 hours. BNP (last 3 results) No results for input(s): PROBNP in the last 8760 hours. HbA1C: No results for input(s): HGBA1C in the last 72 hours. CBG: No results for input(s): GLUCAP in the last 168 hours. Lipid Profile: No results for input(s): CHOL, HDL, LDLCALC, TRIG, CHOLHDL, LDLDIRECT in the last 72 hours. Thyroid Function Tests: No results for input(s): TSH, T4TOTAL, FREET4, T3FREE, THYROIDAB in the last 72 hours. Anemia Panel: No results for input(s):  VITAMINB12, FOLATE, FERRITIN, TIBC, IRON, RETICCTPCT in the last 72 hours. Urine analysis:    Component Value Date/Time   COLORURINE YELLOW (A) 10/09/2015 2125   APPEARANCEUR CLEAR (A) 10/09/2015 2125   LABSPEC 1.010 10/09/2015 2125   PHURINE 8.0 10/09/2015 2125   GLUCOSEU NEGATIVE 10/09/2015 2125   HGBUR NEGATIVE 10/09/2015 2125   BILIRUBINUR NEGATIVE 10/09/2015 2125   KETONESUR NEGATIVE 10/09/2015 2125   PROTEINUR NEGATIVE 10/09/2015 2125   NITRITE NEGATIVE 10/09/2015 2125   LEUKOCYTESUR NEGATIVE 10/09/2015 2125    Radiological Exams on Admission: CT Head Wo Contrast  Result Date: 08/25/2019 CLINICAL DATA:  Altered mental status EXAM:  CT HEAD WITHOUT CONTRAST TECHNIQUE: Contiguous axial images were obtained from the base of the skull through the vertex without intravenous contrast. COMPARISON:  None. FINDINGS: Brain: No evidence of acute infarction, hemorrhage, hydrocephalus, extra-axial collection or mass lesion/mass effect. Vascular: No hyperdense vessel or unexpected calcification. Skull: Normal. Negative for fracture or focal lesion. Sinuses/Orbits: No acute finding. Other: None. IMPRESSION: No acute intracranial abnormality noted. Electronically Signed   By: Alcide Clever M.D.   On: 08/25/2019 20:39   CT Abdomen Pelvis W Contrast  Result Date: 08/25/2019 CLINICAL DATA:  Abdominal pain and weakness with black tarry stools EXAM: CT ABDOMEN AND PELVIS WITH CONTRAST TECHNIQUE: Multidetector CT imaging of the abdomen and pelvis was performed using the standard protocol following bolus administration of intravenous contrast. CONTRAST:  OMNIPAQUE IOHEXOL 300 MG/ML  SOLN COMPARISON:  None. FINDINGS: Lower chest: Mild atelectatic changes are noted bilaterally. Hepatobiliary: Fatty infiltration of the liver is noted. Gallbladder has been surgically removed. Pancreas: Unremarkable. No pancreatic ductal dilatation or surrounding inflammatory changes. Spleen: Normal in size without focal  abnormality. Adrenals/Urinary Tract: Adrenal glands are within normal limits. Kidneys are well visualized bilaterally within normal enhancement pattern. No obstructive changes are seen. No renal calculi are noted. Bladder is well distended. Stomach/Bowel: No obstructive or inflammatory changes of the colon are seen. The appendix is within normal limits. No small bowel or gastric abnormality is noted. Vascular/Lymphatic: No significant vascular findings are present. No enlarged abdominal or pelvic lymph nodes. Reproductive: Uterus and bilateral adnexa are unremarkable. Other: Small fat containing umbilical hernia is seen. No ascites is noted. Musculoskeletal: No acute or significant osseous findings. IMPRESSION: Chronic changes without acute abnormality. Electronically Signed   By: Alcide Clever M.D.   On: 08/25/2019 21:38   DG Chest Portable 1 View  Result Date: 08/25/2019 CLINICAL DATA:  Weakness and altered mental status. EXAM: PORTABLE CHEST 1 VIEW COMPARISON:  May 26, 2019 FINDINGS: Mildly decreased lung volumes are seen which is likely, in part, secondary to the degree of patient inspiration. Subsequent vascular crowding is seen without evidence of focal consolidation, pleural effusion or pneumothorax. The heart size and mediastinal contours are within normal limits. Mild degenerative changes seen throughout the thoracic spine. IMPRESSION: 1. Mildly decreased lung volumes with mild vascular crowding. 2. No acute or active cardiopulmonary disease. Electronically Signed   By: Aram Candela M.D.   On: 08/25/2019 20:25    EKG: Independently reviewed.   Assessment/Plan Principal Problem:   Acute metabolic encephalopathy Generalized weakness -Suspect related to combination of dehydration from acute gastrointestinal illness in combination with tramadol taken for pain relief -Head CT with no acute findings -IV hydration -Neurologic checks with fall and aspiration precautions -Given mostly  negative work-up, will get for presyncopal work-up to include cardiac monitoring, echocardiogram and will get EEG as well    CKD (chronic kidney disease) stage 3, GFR 30-59 ml/min -At baseline, continue to monitor    Acute gastroenteritis -Patient developed GI symptoms a few days after her diagnosis for COVID-19 when she was symptomatic for upper respiratory tract infection -Abdominal CT unremarkable -Supportive care with IV hydration, IV antiemetics -Stool PCR    Type 2 diabetes mellitus with stage 3 chronic kidney disease (HCC) -Sliding scale coverage pending med rec    Essential hypertension -Continue home antihypertensive    Covid-19 positive  08/09/19 -Airborne precautions.    Morbid obesity with BMI of 50.0-59.9, adult (HCC) -This complicates overall prognosis and care    DVT prophylaxis: Lovenox  Code Status:  full code  Family Communication:  none  Disposition Plan: Back to previous home environment Consults called: none  Status:obs    Andris Baumann MD [facility]     08/25/2019, 10:48 PM

## 2019-08-25 NOTE — ED Triage Notes (Signed)
Pt arrives from home via [facility] with complaints of weakness, n/v/abdominal pain, altered mental status, lethargy, and black tarry stools  Pt is minimally responsive on arrival. Able to look around, but generally unresponsive to questions.

## 2019-08-25 NOTE — ED Notes (Signed)
Per sister, last known well at 1700 this PM

## 2019-08-25 NOTE — ED Provider Notes (Signed)
[facility] Emergency Department Provider Note   ____________________________________________   First MD Initiated Contact with Patient 08/25/19 2006     (approximate)  I have reviewed the triage vital signs and the nursing notes.   HISTORY  Chief Complaint Altered Mental Status and Weakness    HPI AYLEEN MCKINSTRY is a 49 y.o. female with past medical history of hypertension, hyperlipidemia, diabetes, and GERD who presents to the ED for altered mental status.  History is limited as patient is confused and has difficulty answering questions.  She will only state that "I am tired", and when asked if she is hurting anywhere states that her stomach is bothering her.  Per EMS, patient had complained of nausea, vomiting, and abdominal pain throughout the day today.  Family then found her confused and lethargic, repeatedly falling at home.  Over the phone, sister had stated she returned home from work around 5 AM, immediately went to the bathroom with vomiting and diarrhea, but was awake and alert and her usual self.  When patient sister returned back to the house after work at 6 PM, the patient again had some vomiting and diarrhea, but then suddenly seemed very lightheaded and was found leaning against the wall.  She did not fall to the ground or hit her head but has been confused and weak since then.        Past Medical History:  Diagnosis Date  . Diabetes mellitus without complication (HCC)   . GERD (gastroesophageal reflux disease)   . Hyperlipidemia   . Hypertension     Patient Active Problem List   Diagnosis Date Noted  . CKD (chronic kidney disease) stage 3, GFR 30-59 ml/min 08/25/2019  . Acute metabolic encephalopathy 08/25/2019  . Acute gastroenteritis 08/25/2019  . Type 2 diabetes mellitus with stage 3 chronic kidney disease (HCC) 08/25/2019  . Essential hypertension 08/25/2019  . Generalized weakness 08/25/2019  . Personal history of covid-19   08/09/19 08/25/2019  . Altered mental status 08/25/2019  . Morbid obesity with BMI of 50.0-59.9, adult (HCC) 08/25/2019    Past Surgical History:  Procedure Laterality Date  . CHOLECYSTECTOMY    . TUBAL LIGATION      Prior to Admission medications   Medication Sig Start Date End Date Taking? Authorizing Provider  atorvastatin (LIPITOR) 40 MG tablet Take 1 tablet by mouth at bedtime. 08/21/17   [provider]  doxycycline (VIBRAMYCIN) 100 MG capsule Take 1 capsule (100 mg total) by mouth 2 (two) times daily. 08/06/18   Renford Dills, NP  hydrochlorothiazide (HYDRODIURIL) 25 MG tablet Take 25 mg by mouth daily.    [provider]  insulin NPH Human (HUMULIN N,NOVOLIN N) 100 UNIT/ML injection Inject 12 Units into the skin 2 (two) times daily before a meal.    [provider]  lisinopril (PRINIVIL,ZESTRIL) 10 MG tablet Take 10 mg by mouth daily.    [provider]  mupirocin ointment (BACTROBAN) 2 % Apply two times a day for 7 days. 08/06/18   Renford Dills, NP  omeprazole (PRILOSEC) 40 MG capsule Take 1 capsule by mouth daily. 08/21/17   [provider]    Allergies Morphine and related and Naproxen  Family History  Problem Relation Age of Onset  . Diabetes Mother   . Heart failure Mother   . Kidney failure Mother   . COPD Mother   . Hypertension Mother   . Other Father        "haw river syndrome"  Social History Social History   Tobacco Use  . Smoking status: Former Smoker    Packs/day: 0.50    Types: Cigarettes    Quit date: 05/07/2018    Years since quitting: 1.3  . Smokeless tobacco: Never Used  Substance Use Topics  . Alcohol use: No  . Drug use: Never    Review of Systems Unable to obtain secondary to altered mental status  ____________________________________________   PHYSICAL EXAM:  VITAL SIGNS: ED Triage Vitals  Enc Vitals Group     BP 08/25/19 2004 (!) 158/96     Pulse Rate 08/25/19 2004 84     Resp  08/25/19 2004 16     Temp 08/25/19 2004 98.1 F (36.7 C)     Temp Source 08/25/19 2004 Oral     SpO2 08/25/19 2004 99 %     Weight 08/25/19 2005 (!) 304 lb 3.8 oz (138 kg)     Height 08/25/19 2005 5\' 4"  (1.626 m)     Head Circumference --      Peak Flow --      Pain Score --      Pain Loc --      Pain Edu? --      Excl. in GC? --     Constitutional: Somnolent, opens eyes to voice, unable to follow commands. Eyes: Conjunctivae are normal. Head: Atraumatic. Nose: No congestion/rhinnorhea. Mouth/Throat: Mucous membranes are moist. Neck: Normal ROM Cardiovascular: Normal rate, regular rhythm. Grossly normal heart sounds. Respiratory: Normal respiratory effort.  No retractions. Lungs CTAB. Gastrointestinal: Soft and nondistended. Genitourinary: deferred Musculoskeletal: No lower extremity tenderness nor edema. Neurologic: No verbal response noted, able to open eyes and track myself.  Global weakness noted, attempts to move all 4 extremities with encouragement but unable to lift up off of bed. Skin:  Skin is warm, dry and intact. No rash noted. Psychiatric: Unable to assess.  ____________________________________________   LABS (all labs ordered are listed, but only abnormal results are displayed)  Labs Reviewed  CBC WITH DIFFERENTIAL/PLATELET - Abnormal; Notable for the following components:      Result Value   RBC 3.78 (*)    Hemoglobin 9.5 (*)    HCT 29.7 (*)    MCV 78.6 (*)    MCH 25.1 (*)    RDW 16.7 (*)    All other components within normal limits  COMPREHENSIVE METABOLIC PANEL - Abnormal; Notable for the following components:   Glucose, Bld 172 (*)    BUN 24 (*)    Creatinine, Ser 1.57 (*)    Albumin 3.2 (*)    AST 12 (*)    GFR calc non Af Amer 38 (*)    GFR calc Af Amer 44 (*)    All other components within normal limits  BLOOD GAS, VENOUS - Abnormal; Notable for the following components:   pCO2, Ven 40 (*)    pO2, Ven <31.0 (*)    All other components  within normal limits  ETHANOL  LIPASE, BLOOD  URINE DRUG SCREEN, QUALITATIVE ([facility] ONLY)  URINALYSIS, COMPLETE (UACMP) WITH MICROSCOPIC  HEMOGLOBIN A1C  HIV ANTIBODY (ROUTINE TESTING W REFLEX)  CBC  BASIC METABOLIC PANEL  CBC  SAMPLE TO BLOOD BANK   ____________________________________________  EKG  ED ECG REPORT I, , the attending physician, personally viewed and interpreted this ECG.   Date: 08/25/2019  EKG Time: 23:16  Rate: 60  Rhythm: normal sinus rhythm  Axis: Normal  Intervals:none  ST&T Change: None  PROCEDURES  Procedure(s) performed (including Critical Care):  Procedures   ____________________________________________   INITIAL IMPRESSION / ASSESSMENT AND PLAN / ED COURSE       49 year old female with history of hypertension, diabetes, and GERD presents to the ED for vomiting, diarrhea, and abdominal pain throughout the day today, noted to be confused and globally weak when her sister returned home from work this evening.  She appears globally weak but does not have any apparent focal neurologic deficits, only able to verbally interact minimally to state that she is tired and has abdominal pain.  We will further assess with CT scans of head and abdomen.  Vital signs are stable at this time.  We will check labs including CBC, CMP, ethanol, UA, UDS.  Differential includes intracranial process, infectious process, electrolyte abnormality, seizure, medication effect.  CT imaging of head and abdomen are negative for acute process.  Lab work is essentially unremarkable, electrolytes within normal limits and blood alcohol is negative.  Patient has had some slight improvement in her mental status, now able to participate in conversation more.  She has been taking tramadol for pain and it is potential this contributed or her recent Covid diagnosis could have contributed.  She would benefit from admission for further observation and case was discussed with  hospitalist.      ____________________________________________   FINAL CLINICAL IMPRESSION(S) / ED DIAGNOSES  Final diagnoses:  Altered mental status, unspecified altered mental status type  Generalized abdominal pain  Vomiting and diarrhea     ED Discharge Orders    None       Note:  This document was prepared using Dragon voice recognition software and may include unintentional dictation errors.   Blake Divine, MD 08/25/19 2320

## 2019-08-25 NOTE — ED Notes (Signed)
Pt reports taking tramadol last night, and 1 tylenol this evening  Per pt family, pt reported "itching all over" this evening

## 2019-08-25 NOTE — ED Notes (Signed)
Paternal family history of seizures, none reported by pt

## 2019-08-26 ENCOUNTER — Observation Stay
Admit: 2019-08-26 | Discharge: 2019-08-26 | Disposition: A | Discharge status: Home / Self Care | Payer: BC Managed Care – PPO | Attending: Internal Medicine | Admitting: Internal Medicine

## 2019-08-26 ENCOUNTER — Observation Stay: Payer: BC Managed Care – PPO

## 2019-08-26 ENCOUNTER — Other Ambulatory Visit: Payer: Self-pay

## 2019-08-26 ENCOUNTER — Encounter: Payer: Self-pay | Admitting: Internal Medicine

## 2019-08-26 DIAGNOSIS — B948 Sequelae of other specified infectious and parasitic diseases: Secondary | ICD-10-CM | POA: Diagnosis not present

## 2019-08-26 DIAGNOSIS — Z6841 Body Mass Index (BMI) 40.0 and over, adult: Secondary | ICD-10-CM | POA: Diagnosis not present

## 2019-08-26 DIAGNOSIS — R4182 Altered mental status, unspecified: Secondary | ICD-10-CM | POA: Diagnosis present

## 2019-08-26 DIAGNOSIS — N1832 Chronic kidney disease, stage 3b: Secondary | ICD-10-CM | POA: Diagnosis present

## 2019-08-26 DIAGNOSIS — R1084 Generalized abdominal pain: Secondary | ICD-10-CM

## 2019-08-26 DIAGNOSIS — Y92009 Unspecified place in unspecified non-institutional (private) residence as the place of occurrence of the external cause: Secondary | ICD-10-CM | POA: Diagnosis not present

## 2019-08-26 DIAGNOSIS — Z833 Family history of diabetes mellitus: Secondary | ICD-10-CM | POA: Diagnosis not present

## 2019-08-26 DIAGNOSIS — Z886 Allergy status to analgesic agent status: Secondary | ICD-10-CM | POA: Diagnosis not present

## 2019-08-26 DIAGNOSIS — Z9049 Acquired absence of other specified parts of digestive tract: Secondary | ICD-10-CM | POA: Diagnosis not present

## 2019-08-26 DIAGNOSIS — R1010 Upper abdominal pain, unspecified: Secondary | ICD-10-CM | POA: Diagnosis not present

## 2019-08-26 DIAGNOSIS — W19XXXA Unspecified fall, initial encounter: Secondary | ICD-10-CM | POA: Diagnosis present

## 2019-08-26 DIAGNOSIS — Z8249 Family history of ischemic heart disease and other diseases of the circulatory system: Secondary | ICD-10-CM | POA: Diagnosis not present

## 2019-08-26 DIAGNOSIS — G9341 Metabolic encephalopathy: Secondary | ICD-10-CM | POA: Diagnosis present

## 2019-08-26 DIAGNOSIS — E1122 Type 2 diabetes mellitus with diabetic chronic kidney disease: Secondary | ICD-10-CM | POA: Diagnosis present

## 2019-08-26 DIAGNOSIS — Z841 Family history of disorders of kidney and ureter: Secondary | ICD-10-CM | POA: Diagnosis not present

## 2019-08-26 DIAGNOSIS — E1169 Type 2 diabetes mellitus with other specified complication: Secondary | ICD-10-CM

## 2019-08-26 DIAGNOSIS — I129 Hypertensive chronic kidney disease with stage 1 through stage 4 chronic kidney disease, or unspecified chronic kidney disease: Secondary | ICD-10-CM | POA: Diagnosis present

## 2019-08-26 DIAGNOSIS — Z87891 Personal history of nicotine dependence: Secondary | ICD-10-CM | POA: Diagnosis not present

## 2019-08-26 DIAGNOSIS — Z794 Long term (current) use of insulin: Secondary | ICD-10-CM | POA: Diagnosis not present

## 2019-08-26 DIAGNOSIS — G8929 Other chronic pain: Secondary | ICD-10-CM | POA: Diagnosis present

## 2019-08-26 DIAGNOSIS — E785 Hyperlipidemia, unspecified: Secondary | ICD-10-CM

## 2019-08-26 DIAGNOSIS — R519 Headache, unspecified: Secondary | ICD-10-CM | POA: Diagnosis present

## 2019-08-26 DIAGNOSIS — Z825 Family history of asthma and other chronic lower respiratory diseases: Secondary | ICD-10-CM | POA: Diagnosis not present

## 2019-08-26 DIAGNOSIS — R531 Weakness: Secondary | ICD-10-CM | POA: Diagnosis present

## 2019-08-26 DIAGNOSIS — K219 Gastro-esophageal reflux disease without esophagitis: Secondary | ICD-10-CM | POA: Diagnosis present

## 2019-08-26 DIAGNOSIS — D509 Iron deficiency anemia, unspecified: Secondary | ICD-10-CM | POA: Diagnosis present

## 2019-08-26 DIAGNOSIS — N179 Acute kidney failure, unspecified: Secondary | ICD-10-CM

## 2019-08-26 DIAGNOSIS — U071 COVID-19: Secondary | ICD-10-CM | POA: Diagnosis not present

## 2019-08-26 DIAGNOSIS — Z79899 Other long term (current) drug therapy: Secondary | ICD-10-CM | POA: Diagnosis not present

## 2019-08-26 DIAGNOSIS — I10 Essential (primary) hypertension: Secondary | ICD-10-CM | POA: Diagnosis not present

## 2019-08-26 LAB — CBC
HCT: 28.6 % — ABNORMAL LOW (ref 36.0–46.0)
Hemoglobin: 8.9 g/dL — ABNORMAL LOW (ref 12.0–15.0)
MCH: 25.1 pg — ABNORMAL LOW (ref 26.0–34.0)
MCHC: 31.1 g/dL (ref 30.0–36.0)
MCV: 80.6 fL (ref 80.0–100.0)
Platelets: 266 10*3/uL (ref 150–400)
RBC: 3.55 MIL/uL — ABNORMAL LOW (ref 3.87–5.11)
RDW: 16.3 % — ABNORMAL HIGH (ref 11.5–15.5)
WBC: 8.1 10*3/uL (ref 4.0–10.5)
nRBC: 0 % (ref 0.0–0.2)

## 2019-08-26 LAB — IRON AND TIBC
Iron: 34 ug/dL (ref 28–170)
Saturation Ratios: 14 % (ref 10.4–31.8)
TIBC: 238 ug/dL — ABNORMAL LOW (ref 250–450)
UIBC: 204 ug/dL

## 2019-08-26 LAB — BASIC METABOLIC PANEL
Anion gap: 6 (ref 5–15)
BUN: 21 mg/dL — ABNORMAL HIGH (ref 6–20)
CO2: 25 mmol/L (ref 22–32)
Calcium: 8.8 mg/dL — ABNORMAL LOW (ref 8.9–10.3)
Chloride: 109 mmol/L (ref 98–111)
Creatinine, Ser: 1.46 mg/dL — ABNORMAL HIGH (ref 0.44–1.00)
GFR calc Af Amer: 48 mL/min — ABNORMAL LOW (ref >60)
GFR calc non Af Amer: 42 mL/min — ABNORMAL LOW (ref >60)
Glucose, Bld: 114 mg/dL — ABNORMAL HIGH (ref 70–99)
Potassium: 4.4 mmol/L (ref 3.5–5.1)
Sodium: 140 mmol/L (ref 135–145)

## 2019-08-26 LAB — GLUCOSE, CAPILLARY
Glucose-Capillary: 98 mg/dL (ref 70–99)
Glucose-Capillary: 140 mg/dL — ABNORMAL HIGH (ref 70–99)
Glucose-Capillary: 145 mg/dL — ABNORMAL HIGH (ref 70–99)
Glucose-Capillary: 145 mg/dL — ABNORMAL HIGH (ref 70–99)

## 2019-08-26 LAB — RETIC PANEL
Immature Retic Fract: 22.2 % — ABNORMAL HIGH (ref 2.3–15.9)
RBC.: 3.53 MIL/uL — ABNORMAL LOW (ref 3.87–5.11)
Retic Count, Absolute: 74.8 10*3/uL (ref 19.0–186.0)
Retic Ct Pct: 2.1 % (ref 0.4–3.1)
Reticulocyte Hemoglobin: 28.6 pg (ref >27.9)

## 2019-08-26 LAB — FERRITIN: Ferritin: 35 ng/mL (ref 11–307)

## 2019-08-26 LAB — LIPID PANEL
Cholesterol: 145 mg/dL (ref 0–200)
HDL: 33 mg/dL — ABNORMAL LOW (ref >40)
LDL Cholesterol: 83 mg/dL (ref 0–99)
Total CHOL/HDL Ratio: 4.4 RATIO
Triglycerides: 146 mg/dL (ref <150)
VLDL: 29 mg/dL (ref 0–40)

## 2019-08-26 LAB — CK: Total CK: 62 U/L (ref 38–234)

## 2019-08-26 LAB — HEMOGLOBIN A1C
Hgb A1c MFr Bld: 9.1 % — ABNORMAL HIGH (ref 4.8–5.6)
Mean Plasma Glucose: 214.47 mg/dL

## 2019-08-26 LAB — VITAMIN B12: Vitamin B-12: 452 pg/mL (ref 180–914)

## 2019-08-26 LAB — TSH: TSH: 1.202 u[IU]/mL (ref 0.350–4.500)

## 2019-08-26 LAB — ECHOCARDIOGRAM COMPLETE
Height: 64 in
Weight: 4725 oz

## 2019-08-26 LAB — HIV ANTIBODY (ROUTINE TESTING W REFLEX): HIV Screen 4th Generation wRfx: NONREACTIVE

## 2019-08-26 MED ORDER — ADULT MULTIVITAMIN W/MINERALS CH
1.0000 | ORAL_TABLET | Freq: Every day | ORAL | Status: DC
  Administered 2019-08-26 – 2019-08-27 (×2): 1 via ORAL
  Filled 2019-08-26 (×3): qty 1

## 2019-08-26 MED ORDER — AMITRIPTYLINE HCL 10 MG PO TABS
10.0000 mg | ORAL_TABLET | Freq: Every day | ORAL | Status: DC
  Administered 2019-08-26 – 2019-08-27 (×2): 10 mg via ORAL
  Filled 2019-08-26 (×3): qty 1

## 2019-08-26 MED ORDER — KETOROLAC TROMETHAMINE 30 MG/ML IJ SOLN
15.0000 mg | Freq: Once | INTRAMUSCULAR | Status: AC
  Administered 2019-08-26: 15 mg via INTRAVENOUS
  Filled 2019-08-26: qty 1

## 2019-08-26 MED ORDER — ATORVASTATIN CALCIUM 20 MG PO TABS
40.0000 mg | ORAL_TABLET | Freq: Every day | ORAL | Status: DC
  Administered 2019-08-26 – 2019-08-27 (×2): 40 mg via ORAL
  Filled 2019-08-26 (×2): qty 2

## 2019-08-26 MED ORDER — OXYCODONE HCL 5 MG PO TABS
5.0000 mg | ORAL_TABLET | Freq: Once | ORAL | Status: DC
  Filled 2019-08-26: qty 1

## 2019-08-26 MED ORDER — PANCRELIPASE (LIP-PROT-AMYL) 12000-38000 UNITS PO CPEP
72000.0000 [IU] | ORAL_CAPSULE | Freq: Three times a day (TID) | ORAL | Status: DC
  Filled 2019-08-26 (×9): qty 6

## 2019-08-26 MED ORDER — PANTOPRAZOLE SODIUM 40 MG PO TBEC
40.0000 mg | DELAYED_RELEASE_TABLET | Freq: Every day | ORAL | Status: DC
  Administered 2019-08-26 – 2019-08-28 (×3): 40 mg via ORAL
  Filled 2019-08-26 (×3): qty 1

## 2019-08-26 MED ORDER — INSULIN GLARGINE 100 UNIT/ML ~~LOC~~ SOLN
20.0000 [IU] | Freq: Every day | SUBCUTANEOUS | Status: DC
  Administered 2019-08-26 – 2019-08-27 (×2): 20 [IU] via SUBCUTANEOUS
  Filled 2019-08-26 (×3): qty 0.2

## 2019-08-26 MED ORDER — PROCHLORPERAZINE EDISYLATE 10 MG/2ML IJ SOLN
10.0000 mg | Freq: Once | INTRAMUSCULAR | Status: DC
  Filled 2019-08-26: qty 2

## 2019-08-26 MED ORDER — KETOROLAC TROMETHAMINE 30 MG/ML IJ SOLN
15.0000 mg | Freq: Once | INTRAMUSCULAR | Status: AC
  Administered 2019-08-26: 20:00:00 15 mg via INTRAVENOUS
  Filled 2019-08-26: qty 1

## 2019-08-26 MED ORDER — CLOPIDOGREL BISULFATE 75 MG PO TABS
75.0000 mg | ORAL_TABLET | Freq: Every day | ORAL | Status: DC
  Filled 2019-08-26: qty 1

## 2019-08-26 NOTE — Progress Notes (Cosign Needed)
EEG completed, results pending. 

## 2019-08-26 NOTE — Consult Note (Signed)
Reason for Consult:AMS Requesting Physician: Renae Gloss  CC: AMS  I have been asked by Dr. Renae Gloss to see this patient in consultation for AMS.  HPI: Shelly Benjamin is an 49 y.o. female with medical history significant for diabetes, hypertension obesity, COVID-19 positive on 08/09/2019, who started having vomiting and diarrhea with abdominal pain a couple days ago and was brought into the emergency room for evaluation of altered mental status.  Apparently her sister spoke with her earlier in the day and she was mentating well however later in the afternoon she complained of being tired and was unable to move about as her usual self.  Patient reports that she has been fatigued since having COVID and reports that her body hurts all over, particularly her lower extremities.    Past Medical History:  Diagnosis Date  . Diabetes mellitus without complication (HCC)   . GERD (gastroesophageal reflux disease)   . Hyperlipidemia   . Hypertension     Past Surgical History:  Procedure Laterality Date  . CHOLECYSTECTOMY    . TUBAL LIGATION      Family History  Problem Relation Age of Onset  . Diabetes Mother   . Heart failure Mother   . Kidney failure Mother   . COPD Mother   . Hypertension Mother   . Other Father        "[place] syndrome"    Social History:  reports that she quit smoking about 15 months ago. Her smoking use included cigarettes. She smoked 0.50 packs per day. She has never used smokeless tobacco. She reports that she does not drink alcohol or use drugs.  Allergies  Allergen Reactions  . Morphine And Related Itching  . Naproxen Swelling    Medications:  I have reviewed the patient's current medications. Prior to Admission:  Medications Prior to Admission  Medication Sig Dispense Refill Last Dose  . amitriptyline (ELAVIL) 10 MG tablet Take 10 mg by mouth at bedtime.   08/25/2019 at Unknown time  . atorvastatin (LIPITOR) 40 MG tablet Take 1 tablet by mouth at bedtime.    08/25/2019 at Unknown time  . ferrous sulfate 325 (65 FE) MG EC tablet Take 325 mg by mouth in the morning and at bedtime.   08/25/2019 at Unknown time  . hydrochlorothiazide (HYDRODIURIL) 25 MG tablet Take 25 mg by mouth daily.   08/25/2019 at Unknown time  . insulin glargine (LANTUS) 100 unit/mL SOPN Inject 55 Units into the skin at bedtime.   08/25/2019 at Unknown time  . lisinopril (ZESTRIL) 5 MG tablet Take 10 mg by mouth daily.    08/25/2019 at Unknown time  . Multiple Vitamins-Minerals (ALIVE WOMENS 50+) TABS Take 1 tablet by mouth daily.   08/25/2019 at Unknown time  . Pancrelipase, Lip-Prot-Amyl, 24000-76000 units CPEP Take 1-2 capsules by mouth with breakfast, with lunch, and with evening meal. Take 2 capsules with full meals. Take 1 capsule with half meals or snacks. Do not take if not eating.   08/25/2019 at Unknown time  . Pancrelipase, Lip-Prot-Amyl, 24000-76000 units CPEP Take 1 capsule by mouth with snacks.   08/25/2019 at Unknown time  . pantoprazole (PROTONIX) 40 MG tablet Take 40 mg by mouth daily.   08/25/2019 at Unknown time   Scheduled: . amitriptyline  10 mg Oral QHS  . atorvastatin  40 mg Oral QHS  . clopidogrel  75 mg Oral Daily  . enoxaparin (LOVENOX) injection  40 mg Subcutaneous Q12H  . insulin aspart  0-20 Units Subcutaneous  TID WC  . insulin aspart  0-5 Units Subcutaneous QHS  . insulin glargine  20 Units Subcutaneous Daily  . lipase/protease/amylase  72,000 Units Oral TID with meals  . multivitamin with minerals  1 tablet Oral Daily  . pantoprazole  40 mg Oral Daily  . prochlorperazine  10 mg Intravenous Once  . sodium chloride flush  3 mL Intravenous Q12H    ROS: History obtained from the patient  General ROS:  fatigue Psychological ROS: negative for - behavioral disorder, hallucinations, memory difficulties, mood swings or suicidal ideation Ophthalmic ROS: negative for - blurry vision, double vision, eye pain or loss of vision ENT ROS: negative for - epistaxis,  nasal discharge, oral lesions, sore throat, tinnitus or vertigo Allergy and Immunology ROS: negative for - hives or itchy/watery eyes Hematological and Lymphatic ROS: negative for - bleeding problems, bruising or swollen lymph nodes Endocrine ROS: negative for - galactorrhea, hair pattern changes, polydipsia/polyuria or temperature intolerance Respiratory ROS: negative for - cough, hemoptysis, shortness of breath or wheezing Cardiovascular ROS: negative for - chest pain, dyspnea on exertion, edema or irregular heartbeat Gastrointestinal ROS: negative for - abdominal pain, diarrhea, hematemesis, nausea/vomiting or stool incontinence Genito-Urinary ROS: negative for - dysuria, hematuria, incontinence or urinary frequency/urgency Musculoskeletal ROS: generalized muscle aches Neurological ROS: as noted in HPI Dermatological ROS: negative for rash and skin lesion changes  Physical Examination: Blood pressure (!) 141/94, pulse 66, temperature 97.8 F (36.6 C), temperature source Oral, resp. rate 11, height 5\' 4"  (1.626 m), weight 134 kg, SpO2 100 %.  HEENT-  Normocephalic, no lesions, without obvious abnormality.  Normal external eye and conjunctiva.  Normal TM's bilaterally.  Normal auditory canals and external ears. Normal external nose, mucus membranes and septum.  Normal pharynx. Cardiovascular- S1, S2 normal, pulses palpable throughout   Lungs- chest clear, no wheezing, rales, normal symmetric air entry Abdomen- soft, non-tender; bowel sounds normal; no masses,  no organomegaly Extremities- no edema Lymph-no adenopathy palpable Musculoskeletal-no joint tenderness, deformity or swelling Skin-warm and dry, no hyperpigmentation, vitiligo, or suspicious lesions  Neurological Examination   Mental Status: Alert, oriented to person, place and time.  Able to tell me the President and VP.  Able to tell me how many quarters in a dollar but not haw many nickels in a quarter.  Flat affect.  Speech  fluent without evidence of aphasia.  At times some slowness to respond.  Able to follow 3 step commands without difficulty. Cranial Nerves: II: Discs flat bilaterally; Visual fields grossly normal, pupils equal, round, reactive to light and accommodation III,IV, VI: ptosis not present, extra-ocular motions intact bilaterally V,VII: smile symmetric, facial light touch sensation normal bilaterally VIII: hearing normal bilaterally IX,X: gag reflex present XI: bilateral shoulder shrug XII: midline tongue extension Motor: Right : Upper extremity   5/5    Left:     Upper extremity   5/5  Lower extremity   5/5     Lower extremity   5/5 Tone and bulk:normal tone throughout; no atrophy noted Sensory: Pinprick and light touch intact throughout, bilaterally Deep Tendon Reflexes: Symmetric throughout Plantars: Right: mute   Left: mute Cerebellar: Normal finger-to-nose and normal heel-to-shin testing bilaterally Gait: not tested due to safety concerns    Laboratory Studies:   Basic Metabolic Panel: Recent Labs  Lab 08/25/19 2018 08/26/19 0428  NA 137 140  K 3.9 4.4  CL 105 109  CO2 23 25  GLUCOSE 172* 114*  BUN 24* 21*  CREATININE 1.57* 1.46*  CALCIUM  8.9 8.8*    Liver Function Tests: Recent Labs  Lab 08/25/19 2018  AST 12*  ALT 12  ALKPHOS 75  BILITOT 0.6  PROT 7.6  ALBUMIN 3.2*   Recent Labs  Lab 08/25/19 2018  LIPASE 43   No results for input(s): AMMONIA in the last 168 hours.  CBC: Recent Labs  Lab 08/25/19 2018 08/26/19 0428  WBC 9.3 8.1  NEUTROABS 5.4  --   HGB 9.5* 8.9*  HCT 29.7* 28.6*  MCV 78.6* 80.6  PLT 299 266    Cardiac Enzymes: No results for input(s): CKTOTAL, CKMB, CKMBINDEX, TROPONINI in the last 168 hours.  BNP: Invalid input(s): POCBNP  CBG: Recent Labs  Lab 08/26/19 0830  GLUCAP 98    Microbiology: Results for orders placed or performed in visit on 05/25/19  Novel Coronavirus, NAA ([facility])     Status: None   Collection Time:  05/25/19  3:00 PM   Specimen: Nasopharyngeal(NP) swabs in vial transport medium   NASOPHARYNGE  TESTING  Result Value Ref Range Status   SARS-CoV-2, NAA Not Detected Not Detected Final    Comment: This nucleic acid amplification test was developed and its performance characteristics determined by [employer]. Nucleic acid amplification tests include RT-PCR and TMA. This test has not been FDA cleared or approved. This test has been authorized by FDA under an Emergency Use Authorization (EUA). This test is only authorized for the duration of time the declaration that circumstances exist justifying the authorization of the emergency use of in vitro diagnostic tests for detection of SARS-CoV-2 virus and/or diagnosis of COVID-19 infection under section 564(b)(1) of the Act, 21 U.S.C. 657QIO-9(G) (1), unless the authorization is terminated or revoked sooner. When diagnostic testing is negative, the possibility of a false negative result should be considered in the context of a patient's recent exposures and the presence of clinical signs and symptoms consistent with COVID-19. An individual without symptoms of COVID-19 and who is not shedding SARS-CoV-2 virus wo uld expect to have a negative (not detected) result in this assay.     Coagulation Studies: No results for input(s): LABPROT, INR in the last 72 hours.  Urinalysis:  Recent Labs  Lab 08/25/19 2310  COLORURINE YELLOW*  LABSPEC 1.023  PHURINE 7.0  GLUCOSEU NEGATIVE  HGBUR NEGATIVE  BILIRUBINUR NEGATIVE  KETONESUR NEGATIVE  PROTEINUR 30*  NITRITE NEGATIVE  LEUKOCYTESUR NEGATIVE    Lipid Panel:     Component Value Date/Time   CHOL 145 08/26/2019 0428   TRIG 146 08/26/2019 0428   HDL 33 (L) 08/26/2019 0428   CHOLHDL 4.4 08/26/2019 0428   VLDL 29 08/26/2019 0428   LDLCALC 83 08/26/2019 0428    HgbA1C:  Lab Results  Component Value Date   HGBA1C 9.1 (H) 08/26/2019    Urine Drug Screen:      Component  Value Date/Time   LABOPIA NONE DETECTED 08/25/2019 2310   COCAINSCRNUR NONE DETECTED 08/25/2019 2310   LABBENZ NONE DETECTED 08/25/2019 2310   AMPHETMU NONE DETECTED 08/25/2019 2310   THCU NONE DETECTED 08/25/2019 2310   LABBARB NONE DETECTED 08/25/2019 2310    Alcohol Level:  Recent Labs  Lab 08/25/19 2018  ETH <10    Other results: EKG: sinus rhythm at 60 bpm.  Imaging: CT Head Wo Contrast  Result Date: 08/25/2019 CLINICAL DATA:  Altered mental status EXAM: CT HEAD WITHOUT CONTRAST TECHNIQUE: Contiguous axial images were obtained from the base of the skull through the vertex without intravenous contrast. COMPARISON:  None. FINDINGS:  Brain: No evidence of acute infarction, hemorrhage, hydrocephalus, extra-axial collection or mass lesion/mass effect. Vascular: No hyperdense vessel or unexpected calcification. Skull: Normal. Negative for fracture or focal lesion. Sinuses/Orbits: No acute finding. Other: None. IMPRESSION: No acute intracranial abnormality noted. Electronically Signed   By: Alcide Clever M.D.   On: 08/25/2019 20:39   CT Abdomen Pelvis W Contrast  Result Date: 08/25/2019 CLINICAL DATA:  Abdominal pain and weakness with black tarry stools EXAM: CT ABDOMEN AND PELVIS WITH CONTRAST TECHNIQUE: Multidetector CT imaging of the abdomen and pelvis was performed using the standard protocol following bolus administration of intravenous contrast. CONTRAST:  OMNIPAQUE IOHEXOL 300 MG/ML  SOLN COMPARISON:  None. FINDINGS: Lower chest: Mild atelectatic changes are noted bilaterally. Hepatobiliary: Fatty infiltration of the liver is noted. Gallbladder has been surgically removed. Pancreas: Unremarkable. No pancreatic ductal dilatation or surrounding inflammatory changes. Spleen: Normal in size without focal abnormality. Adrenals/Urinary Tract: Adrenal glands are within normal limits. Kidneys are well visualized bilaterally within normal enhancement pattern. No obstructive changes are seen.  No renal calculi are noted. Bladder is well distended. Stomach/Bowel: No obstructive or inflammatory changes of the colon are seen. The appendix is within normal limits. No small bowel or gastric abnormality is noted. Vascular/Lymphatic: No significant vascular findings are present. No enlarged abdominal or pelvic lymph nodes. Reproductive: Uterus and bilateral adnexa are unremarkable. Other: Small fat containing umbilical hernia is seen. No ascites is noted. Musculoskeletal: No acute or significant osseous findings. IMPRESSION: Chronic changes without acute abnormality. Electronically Signed   By: Alcide Clever M.D.   On: 08/25/2019 21:38   DG Chest Portable 1 View  Result Date: 08/25/2019 CLINICAL DATA:  Weakness and altered mental status. EXAM: PORTABLE CHEST 1 VIEW COMPARISON:  May 26, 2019 FINDINGS: Mildly decreased lung volumes are seen which is likely, in part, secondary to the degree of patient inspiration. Subsequent vascular crowding is seen without evidence of focal consolidation, pleural effusion or pneumothorax. The heart size and mediastinal contours are within normal limits. Mild degenerative changes seen throughout the thoracic spine. IMPRESSION: 1. Mildly decreased lung volumes with mild vascular crowding. 2. No acute or active cardiopulmonary disease. Electronically Signed   By: Aram Candela M.D.   On: 08/25/2019 20:25     Assessment/Plan: 49 y.o. female with medical history significant for diabetes, hypertension obesity, COVID-19 positive on 08/09/2019, who started having vomiting and diarrhea with abdominal pain a couple days ago and was brought into the emergency room for evaluation of altered mental status.  Patient complaining of fatigue and having difficulty moving a round.  Patient reports having these issues since her diagnosis.  No focality no neurological examination.  Cognitive presentation likely secondary to COVID but will rule out other possibilities as well.  UDS  negative.    Recommendations: 1. B12, B1, TSH, CK 2. EEG 3. MRI of the brain without contrast  Case discussed with Dr. Yetta Barre, MD Neurology [phone] 08/26/2019, 11:33 AM

## 2019-08-26 NOTE — Progress Notes (Signed)
Patient ID: Shelly Benjamin, female   DOB: 10-08-1970, 49 y.o.   MRN: [id number] [facility] PROGRESS NOTE  JOURDEN DELMONT VOJ:[id number] DOB: [birth year] DOA: 08/25/2019 PCP: System, Pcp Not In  HPI/Subjective: Patient came in with altered mental status.  Patient states that she was back to work and working third shift.  She came in with right-sided abdominal pain and thought her pancreas was inflamed and she took a tramadol.  She then was sleeping quite a bit.  Today she complains of left-sided weakness and incoordination.  Objective: Vitals:   08/26/19 0615 08/26/19 0833  BP: 134/68 (!) 141/94  Pulse: (!) 55 66  Resp: 12 11  Temp: 97.7 F (36.5 C) 97.8 F (36.6 C)  SpO2: 98% 100%    Intake/Output Summary (Last 24 hours) at 08/26/2019 1149 Last data filed at 08/26/2019 0343 Gross per 24 hour  Intake 94.3 ml  Output --  Net 94.3 ml   Filed Weights   08/25/19 2005 08/26/19 0033 08/26/19 0621  Weight: (!) 138 kg 122.6 kg 134 kg    ROS: Review of Systems  Constitutional: Positive for malaise/fatigue. Negative for fever.  Eyes: Negative for blurred vision.  Respiratory: Negative for cough and shortness of breath.   Cardiovascular: Negative for chest pain.  Gastrointestinal: Positive for nausea. Negative for abdominal pain, constipation, diarrhea and vomiting.  Genitourinary: Negative for dysuria.  Musculoskeletal: Negative for joint pain.  Neurological: Positive for weakness. Negative for dizziness.   Exam: Physical Exam  Constitutional: She is oriented to person, place, and time.  HENT:  Nose: No mucosal edema.  Mouth/Throat: No oropharyngeal exudate or posterior oropharyngeal edema.  Eyes: Pupils are equal, round, and reactive to light. Conjunctivae, EOM and lids are normal.  Neck: Carotid bruit is not present.  Cardiovascular: S1 normal and S2 normal. Exam reveals no gallop.  No murmur heard. Respiratory: No respiratory distress. She has decreased breath sounds  in the right lower field and the left lower field. She has no wheezes. She has no rhonchi. She has no rales.  GI: Soft. Bowel sounds are normal. There is no abdominal tenderness.  Musculoskeletal:     Right ankle: No swelling.     Left ankle: No swelling.  Lymphadenopathy:    She has no cervical adenopathy.  Neurological: She is alert and oriented to person, place, and time. No cranial nerve deficit.  Patient has a left shoulder frozen shoulder so cannot move that to well.  4 out of 5 strength left arm and leg.  5 out of 5 right arm and leg.  Skin: Skin is warm. No rash noted. Nails show no clubbing.  Psychiatric: She has a normal mood and affect.      Data Reviewed: Basic Metabolic Panel: Recent Labs  Lab 08/25/19 2018 08/26/19 0428  NA 137 140  K 3.9 4.4  CL 105 109  CO2 23 25  GLUCOSE 172* 114*  BUN 24* 21*  CREATININE 1.57* 1.46*  CALCIUM 8.9 8.8*   Liver Function Tests: Recent Labs  Lab 08/25/19 2018  AST 12*  ALT 12  ALKPHOS 75  BILITOT 0.6  PROT 7.6  ALBUMIN 3.2*   Recent Labs  Lab 08/25/19 2018  LIPASE 43   CBC: Recent Labs  Lab 08/25/19 2018 08/26/19 0428  WBC 9.3 8.1  NEUTROABS 5.4  --   HGB 9.5* 8.9*  HCT 29.7* 28.6*  MCV 78.6* 80.6  PLT 299 266    CBG: Recent Labs  Lab 08/26/19 0830  GLUCAP 98      Studies: CT Head Wo Contrast  Result Date: 08/25/2019 CLINICAL DATA:  Altered mental status EXAM: CT HEAD WITHOUT CONTRAST TECHNIQUE: Contiguous axial images were obtained from the base of the skull through the vertex without intravenous contrast. COMPARISON:  None. FINDINGS: Brain: No evidence of acute infarction, hemorrhage, hydrocephalus, extra-axial collection or mass lesion/mass effect. Vascular: No hyperdense vessel or unexpected calcification. Skull: Normal. Negative for fracture or focal lesion. Sinuses/Orbits: No acute finding. Other: None. IMPRESSION: No acute intracranial abnormality noted. Electronically Signed   By: Alcide Clever M.D.   On: 08/25/2019 20:39   CT Abdomen Pelvis W Contrast  Result Date: 08/25/2019 CLINICAL DATA:  Abdominal pain and weakness with black tarry stools EXAM: CT ABDOMEN AND PELVIS WITH CONTRAST TECHNIQUE: Multidetector CT imaging of the abdomen and pelvis was performed using the standard protocol following bolus administration of intravenous contrast. CONTRAST:  OMNIPAQUE IOHEXOL 300 MG/ML  SOLN COMPARISON:  None. FINDINGS: Lower chest: Mild atelectatic changes are noted bilaterally. Hepatobiliary: Fatty infiltration of the liver is noted. Gallbladder has been surgically removed. Pancreas: Unremarkable. No pancreatic ductal dilatation or surrounding inflammatory changes. Spleen: Normal in size without focal abnormality. Adrenals/Urinary Tract: Adrenal glands are within normal limits. Kidneys are well visualized bilaterally within normal enhancement pattern. No obstructive changes are seen. No renal calculi are noted. Bladder is well distended. Stomach/Bowel: No obstructive or inflammatory changes of the colon are seen. The appendix is within normal limits. No small bowel or gastric abnormality is noted. Vascular/Lymphatic: No significant vascular findings are present. No enlarged abdominal or pelvic lymph nodes. Reproductive: Uterus and bilateral adnexa are unremarkable. Other: Small fat containing umbilical hernia is seen. No ascites is noted. Musculoskeletal: No acute or significant osseous findings. IMPRESSION: Chronic changes without acute abnormality. Electronically Signed   By: Alcide Clever M.D.   On: 08/25/2019 21:38   DG Chest Portable 1 View  Result Date: 08/25/2019 CLINICAL DATA:  Weakness and altered mental status. EXAM: PORTABLE CHEST 1 VIEW COMPARISON:  May 26, 2019 FINDINGS: Mildly decreased lung volumes are seen which is likely, in part, secondary to the degree of patient inspiration. Subsequent vascular crowding is seen without evidence of focal consolidation, pleural  effusion or pneumothorax. The heart size and mediastinal contours are within normal limits. Mild degenerative changes seen throughout the thoracic spine. IMPRESSION: 1. Mildly decreased lung volumes with mild vascular crowding. 2. No acute or active cardiopulmonary disease. Electronically Signed   By: Aram Candela M.D.   On: 08/25/2019 20:25    Scheduled Meds: . amitriptyline  10 mg Oral QHS  . atorvastatin  40 mg Oral QHS  . clopidogrel  75 mg Oral Daily  . enoxaparin (LOVENOX) injection  40 mg Subcutaneous Q12H  . insulin aspart  0-20 Units Subcutaneous TID WC  . insulin aspart  0-5 Units Subcutaneous QHS  . insulin glargine  20 Units Subcutaneous Daily  . lipase/protease/amylase  72,000 Units Oral TID with meals  . multivitamin with minerals  1 tablet Oral Daily  . pantoprazole  40 mg Oral Daily  . prochlorperazine  10 mg Intravenous Once  . sodium chloride flush  3 mL Intravenous Q12H   Continuous Infusions: . sodium chloride 50 mL/hr at 08/26/19 1040    Assessment/Plan:  1. Acute metabolic encephalopathy with left-sided weakness.  I ordered stroke work-up including MRI of the brain, carotid ultrasound.  Echocardiogram and EEG already ordered.  Case discussed with neurology.  Also checking a B12.  This  could be Covid encephalitis.  I ordered Plavix for now.  PT and OT consultations.  Avoid tramadol 2. Acute kidney injury.  Creatinine 1.62 and has come down to 1.46 with IV fluids.  Continue hydration. 3. Type 2 diabetes mellitus with hyperlipidemia on atorvastatin.  LDL 83.  If this is a stroke goal will be less than 70.  Patient on sliding scale insulin and I gave a lower dose glargine insulin. 4. Abdominal pain.  CT scan of the abdomen negative and lipase normal range.  Continue pancreatic enzymes. 5. Anemia unspecified.  Send off iron studies.  B12 ordered by neurology.  Code Status:     Code Status Orders  (From admission, onward)         Start     Ordered   08/25/19  2244  Full code  Continuous     08/25/19 2247        Code Status History    This patient has a current code status but no historical code status.   Advance Care Planning Activity     Family Communication: Spoke with the patients sister on the phone. Disposition Plan: With her left-sided weakness and encephalopathy need to do the stroke work-up including MRI of the brain, carotid ultrasound and echocardiogram.  Patient also has an EEG ordered.  I would also like to see how she does with physical therapy and Occupational Therapy.  This could be a Covid encephalopathy if stroke work-up negative.  Potential disposition home tomorrow if doing better.  Would like to see creatinine a little bit better prior to disposition.  Consultants:  Neurology  Time spent: 28 minutes  [place]

## 2019-08-26 NOTE — Evaluation (Signed)
Occupational Therapy Evaluation Patient Details Name: Shelly Benjamin MRN: [id number] DOB: 1970-06-22 Today's Date: 08/26/2019    History of Present Illness 50 y.o. female with medical history significant for diabetes, hypertension obesity, COVID-19 positive on 08/09/2019, who started having vomiting and diarrhea with abdominal pain a couple days ago and was brought into the emergency room for evaluation of altered mental status.  Apparently her sister spoke with her earlier in the day and she was mentating well however later in the afternoon she complained of being tired and was unable to move about as her usual self.  Patient reports that she has been fatigued since having COVID and reports that her body hurts all over, particularly her lower extremities.  MRI negative for acute infarct.   Clinical Impression   Pt was seen for OT evaluation and co-tx with PT this date. Prior to hospital admission and before recent Covid diagnosis pt was active and independent, working full time 3rd shift and caring for her granddaughter during the day. Pt endorses since Covid, has had blurry vision, numbness/tingling in R hand's 3rd and 4th digits (not radiating), neuropathy in B feet, and blurry vision, leading to increased difficulty with night vision and needing to drive with her bright lights on in order to get to/fromt work.  Pt lives with her sister, son, and granddaughter. Currently pt demonstrates impairments as described below (See OT problem list) which functionally limit her ability to perform ADL/self-care tasks. Pt currently requires supervision to CGA for ADL transfers and sit to stand ADL tasks. Difficulty reading menu in natural light but improved with bright overhead room lights on. Close SBA-CGA for ADL mobility in room. Pt would benefit from skilled OT to address noted impairments and functional limitations (see below for any additional details) in order to maximize safety and independence while  minimizing falls risk and caregiver burden. Upon hospital discharge, recommend HHOT to maximize pt safety and return to functional independence during meaningful occupations of daily life.     Follow Up Recommendations  Home health OT    Equipment Recommendations  None recommended by OT    Recommendations for Other Services       Precautions / Restrictions Precautions Precautions: Fall Restrictions Weight Bearing Restrictions: No      Mobility Bed Mobility Overal bed mobility: Needs Assistance Bed Mobility: Supine to Sit     Supine to sit: Supervision;HOB elevated        Transfers Overall transfer level: Needs assistance Equipment used: Rolling walker (2 wheeled) Transfers: Sit to/from Stand Sit to Stand: Supervision         General transfer comment: mildly impulsive standing before prompted    Balance Overall balance assessment: Mild deficits observed, not formally tested                                         ADL either performed or assessed with clinical judgement   ADL Overall ADL's : Needs assistance/impaired                                       General ADL Comments: supervision for sit to stand ADL tasks for safety, supervision for ADL mobility     Vision Baseline Vision/History: Wears glasses Wears Glasses: At all times(reports too strong now) Patient Visual Report: Blurring of  vision(increased difficulty in low lighting (reports having to drive with bright lights on when driving to/from work on 3rd shift)) Vision Assessment?: Yes Eye Alignment: Impaired (comment)(L>R eye possible slight lateral strabismus at times, not constant) Ocular Range of Motion: Within Functional Limits Alignment/Gaze Preference: Within Defined Limits Tracking/Visual Pursuits: Able to track stimulus in all quads without difficulty Convergence: Within functional limits Visual Fields: No apparent deficits     Perception     Praxis       Pertinent Vitals/Pain Pain Assessment: 0-10 Pain Score: 6  Pain Location: RUQ abdomen Pain Descriptors / Indicators: Aching Pain Intervention(s): Limited activity within patient's tolerance;Monitored during session;Repositioned;RN gave pain meds during session     Hand Dominance Right   Extremity/Trunk Assessment Upper Extremity Assessment Upper Extremity Assessment: RUE deficits/detail;LUE deficits/detail;Generalized weakness RUE Deficits / Details: 3rd and 4th numbness for ~47mo, grip 3+/5, proximal 4/5 RUE Sensation: decreased light touch LUE Deficits / Details: decr FMC with finger to nose testing, 3/5 proximally, 3+/5 grip LUE Coordination: decreased fine motor   Lower Extremity Assessment Lower Extremity Assessment: LLE deficits/detail;RLE deficits/detail;Defer to PT evaluation RLE Deficits / Details: 4+/5, neuropathy in feet RLE Sensation: history of peripheral neuropathy RLE Coordination: decreased fine motor LLE Deficits / Details: 3+/5, neuropathy in feet LLE Sensation: history of peripheral neuropathy       Communication Communication Communication: No difficulties   Cognition Arousal/Alertness: Awake/alert Behavior During Therapy: WFL for tasks assessed/performed Overall Cognitive Status: Within Functional Limits for tasks assessed                                 General Comments: grossly WFL, A&Ox4, increased processing time, cues for multistep commands   General Comments       Exercises     Shoulder Instructions      Home Living Family/patient expects to be discharged to:: Private residence Living Arrangements: Other relatives;Children(sister, son, granddaughter) Available Help at Discharge: Family;Available 24 hours/day;Available PRN/intermittently Type of Home: House Home Access: Stairs to enter [employer] of Steps: 2 steps no rails in front - primary entrance Entrance Stairs-Rails: None Home Layout: One level                Home Equipment: Walker - 2 wheels;Hospital bed;Wheelchair - manual   Additional Comments: equipment was her mother's      Prior Functioning/Environment Level of Independence: Independent        Comments: Working 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, standing on feet for long periods of time), indep with mobility; indep w ADL and IADL, driving, 1 fall; helps granddaughter with school        OT Problem List: Impaired balance (sitting and/or standing);Decreased knowledge of use of DME or AE;Decreased strength;Decreased coordination;Impaired sensation;Decreased cognition;Decreased activity tolerance;Impaired vision/perception      OT Treatment/Interventions: Self-care/ADL training;Therapeutic exercise;Therapeutic activities;Neuromuscular education;Energy conservation;Visual/perceptual remediation/compensation;DME and/or AE instruction;Patient/family education;Balance training    OT Goals(Current goals can be found in the care plan section) Acute Rehab OT Goals Patient Stated Goal: go home OT Goal Formulation: With patient Time For Goal Achievement: 09/09/19 Potential to Achieve Goals: Good ADL Goals Pt Will Perform Lower Body Dressing: with modified independence;sit to/from stand;with adaptive equipment Pt Will Transfer to Toilet: with modified independence;ambulating;regular height toilet(LRAD for amb) Additional ADL Goal #1: Pt will verbalize plan to implement at least 2 learned energy conservation strategies to maximize safety/indep with ADL and IADL  OT Frequency: Min 2X/week   Barriers  to D/C:            Co-evaluation PT/OT/SLP Co-Evaluation/Treatment: Yes Reason for Co-Treatment: For patient/therapist safety;To address functional/ADL transfers PT goals addressed during session: Mobility/safety with mobility;Balance;Proper use of DME OT goals addressed during session: ADL's and self-care      AM-PAC OT "6 Clicks" Daily Activity     Outcome Measure Help from  another person eating meals?: None Help from another person taking care of personal grooming?: None Help from another person toileting, which includes using toliet, bedpan, or urinal?: A Little Help from another person bathing (including washing, rinsing, drying)?: A Little Help from another person to put on and taking off regular upper body clothing?: None Help from another person to put on and taking off regular lower body clothing?: A Little 6 Click Score: 21   End of Session Equipment Utilized During Treatment: Gait belt;Rolling walker Nurse Communication: Other (comment);Mobility status(no need for purewick)  Activity Tolerance: Patient tolerated treatment well Patient left: in chair;with call bell/phone within reach;with chair alarm set  OT Visit Diagnosis: Other abnormalities of gait and mobility (R26.89);Hemiplegia and hemiparesis;Low vision, both eyes (H54.2) Hemiplegia - Right/Left: Left Hemiplegia - dominant/non-dominant: Non-Dominant Hemiplegia - caused by: Unspecified                Time: 8185-6314 OT Time Calculation (min): 41 min Charges:  OT General Charges $OT Visit: 1 Visit OT Evaluation $OT Eval Moderate Complexity: 1 Mod  Jeni Salles, MPH, MS, OTR/L ascom [phone] 08/26/19, 4:05 PM

## 2019-08-26 NOTE — Progress Notes (Signed)
PHARMACIST - PHYSICIAN COMMUNICATION  CONCERNING:  Enoxaparin (Lovenox) for DVT Prophylaxis    RECOMMENDATION: Patient was prescribed enoxaprin 40mg  q24 hours for VTE prophylaxis.   Filed Weights   08/25/19 2005  Weight: (!) 138 kg (304 lb 3.8 oz)    Body mass index is 52.22 kg/m.  Estimated Creatinine Clearance: 59.6 mL/min (A) (by C-G formula based on SCr of 1.57 mg/dL (H)).   Based on [facility] policy patient is candidate for enoxaparin 40mg  every 12 hour dosing due to BMI being >40.   DESCRIPTION: Pharmacy has adjusted enoxaparin dose per [facility] policy.  Patient is now receiving enoxaparin 40mg  every 12 hours.   , PharmD Clinical Pharmacist  08/26/2019 12:38 AM

## 2019-08-26 NOTE — Procedures (Signed)
ELECTROENCEPHALOGRAM REPORT   Patient: Shelly Benjamin       Room #: 104A-AA EEG No. ID: [id number] Age: 49 y.o.        Sex: female Requesting Physician: Renae Gloss Report Date:  08/26/2019        Interpreting Physician: Thana Farr  History: Shelly Benjamin is an 49 y.o. female with altered mental status  Medications:  Lipitor, Elavil, Insulin  Conditions of Recording:  This is a 21 channel routine scalp EEG performed with bipolar and monopolar montages arranged in accordance to the international 10/20 system of electrode placement. One channel was dedicated to EKG recording.  The patient is in the awake, drowsy and asleep states.  Description:  The waking background activity consists of a low voltage, symmetrical, fairly well organized, 9 Hz alpha activity, seen from the parieto-occipital and posterior temporal regions.  Low voltage fast activity, poorly organized, is seen anteriorly and is at times superimposed on more posterior regions.  A mixture of theta and alpha rhythms are seen from the central and temporal regions. The patient drowses with slowing to irregular, low voltage theta and beta activity.   The patient goes in to a light sleep with symmetrical sleep spindles, vertex central sharp transients and irregular slow activity.  No epileptiform activity is noted.   Hyperventilation was not performed. Intermittent photic stimulation was performed but failed to illicit any change in the tracing.  IMPRESSION: Normal electroencephalogram, awake, asleep and with activation procedures. There are no focal lateralizing or epileptiform features.   Thana Farr, MD Neurology [phone] 08/26/2019, 2:06 PM

## 2019-08-26 NOTE — Progress Notes (Signed)
*  PRELIMINARY RESULTS* Echocardiogram 2D Echocardiogram has been performed.  Shelly Benjamin 08/26/2019, 11:22 AM

## 2019-08-26 NOTE — Evaluation (Signed)
Physical Therapy Evaluation Patient Details Name: Shelly Benjamin MRN: [id number] DOB: 1970/11/11 Today's Date: 08/26/2019   History of Present Illness  49 y.o. female with medical history significant for diabetes, hypertension obesity, COVID-19 positive on 08/09/2019, who started having vomiting and diarrhea with abdominal pain a couple days ago and was brought into the emergency room for evaluation of altered mental status.  Apparently her sister spoke with her earlier in the day and she was mentating well however later in the afternoon she complained of being tired and was unable to move about as her usual self.  Patient reports that she has been fatigued since having COVID and reports that her body hurts all over, particularly her lower extremities.  MRI negative for acute infarct.  Clinical Impression  Pt is a pleasant 49 year old female who was admitted for acute metabolic encephalopathy. All mobility performed on RA with sats WNL. Pt reports blurry vision, OT in room for formal assessment. Pt performs bed mobility/transfers with supervision and ambulation with RW and further ambulation without AD. Recommending continued use of AD at all times due to deficit. Pt demonstrates deficits with L side strength, balance/mobility. Pt very active previously and eager to participate. Would benefit from skilled PT to address above deficits and promote optimal return to PLOF. Recommend transition to HHPT upon discharge from acute hospitalization.     Follow Up Recommendations Home health PT    Equipment Recommendations  Rolling walker with 5" wheels(pt reports she has her mom's previous RW)    Recommendations for Other Services       Precautions / Restrictions Precautions Precautions: Fall Restrictions Weight Bearing Restrictions: No      Mobility  Bed Mobility Overal bed mobility: Needs Assistance Bed Mobility: Supine to Sit     Supine to sit: Supervision;HOB elevated     General bed  mobility comments: ease of transition  Transfers Overall transfer level: Needs assistance Equipment used: Rolling walker (2 wheeled) Transfers: Sit to/from Stand Sit to Stand: Supervision         General transfer comment: mildly impulsive standing before prompted  Ambulation/Gait Ambulation/Gait assistance: Min guard Gait Distance (Feet): 40 Feet Assistive device: Rolling walker (2 wheeled) Gait Pattern/deviations: Step-through pattern     General Gait Details: needs cues for obstacle avoidance. Slow speed.  Stairs            Wheelchair Mobility    Modified Rankin (Stroke Patients Only)       Balance Overall balance assessment: Mild deficits observed, not formally tested                                           Pertinent Vitals/Pain Pain Assessment: 0-10 Pain Score: 6  Pain Location: RUQ abdomen Pain Descriptors / Indicators: Aching Pain Intervention(s): Limited activity within patient's tolerance;RN gave pain meds during session    Home Living Family/patient expects to be discharged to:: Private residence Living Arrangements: Other relatives;Children(sister, son, granddaughter) Available Help at Discharge: Family;Available 24 hours/day;Available PRN/intermittently Type of Home: House Home Access: Stairs to enter Entrance Stairs-Rails: None Entrance Stairs-Number of Steps: 2 steps no rails in front - primary entrance Home Layout: One level Home Equipment: Walker - 2 wheels;Hospital bed;Wheelchair - manual;Wheelchair - power Additional Comments: equipment was her mother's    Prior Function Level of Independence: Independent         Comments: Working full  time 3rd shift (heavy lifting, standing on feet for long periods of time), indep with mobility; indep w ADL and IADL, driving, 1 fall; helps granddaughter with school     Hand Dominance   Dominant Hand: Right    Extremity/Trunk Assessment   Upper Extremity Assessment Upper  Extremity Assessment: Generalized weakness;LUE deficits/detail RUE Deficits / Details: 3rd and 4th numbness for ~16mo, grip 3+/5, proximal 4/5 RUE Sensation: decreased light touch LUE Deficits / Details: decr FMC with finger to nose testing, 3/5 proximally, 3+/5 grip LUE Coordination: decreased fine motor    Lower Extremity Assessment Lower Extremity Assessment: LLE deficits/detail RLE Deficits / Details: 4+/5, neuropathy in feet RLE Sensation: history of peripheral neuropathy RLE Coordination: decreased fine motor LLE Deficits / Details: 3+/5, neuropathy in feet LLE Sensation: history of peripheral neuropathy       Communication   Communication: No difficulties  Cognition Arousal/Alertness: Awake/alert Behavior During Therapy: WFL for tasks assessed/performed Overall Cognitive Status: Within Functional Limits for tasks assessed                                 General Comments: grossly WFL, A&Ox4, increased processing time, cues for multistep commands      General Comments      Exercises Other Exercises Other Exercises: Further ambulation performed x 40' with no AD with min assist with unsteadiness noted. L LE fatigued quickly   Assessment/Plan    PT Assessment Patient needs continued PT services  PT Problem List Decreased strength;Decreased activity tolerance;Decreased balance;Decreased mobility;Decreased knowledge of use of DME;Impaired sensation       PT Treatment Interventions Gait training;Therapeutic exercise;Balance training;DME instruction    PT Goals (Current goals can be found in the Care Plan section)  Acute Rehab PT Goals Patient Stated Goal: go home PT Goal Formulation: With patient Time For Goal Achievement: 09/09/19 Potential to Achieve Goals: Good    Frequency Min 2X/week   Barriers to discharge        Co-evaluation PT/OT/SLP Co-Evaluation/Treatment: Yes Reason for Co-Treatment: To address functional/ADL transfers PT goals  addressed during session: Mobility/safety with mobility;Balance;Proper use of DME OT goals addressed during session: ADL's and self-care       AM-PAC PT "6 Clicks" Mobility  Outcome Measure Help needed turning from your back to your side while in a flat bed without using bedrails?: None Help needed moving from lying on your back to sitting on the side of a flat bed without using bedrails?: None Help needed moving to and from a bed to a chair (including a wheelchair)?: A Little Help needed standing up from a chair using your arms (e.g., wheelchair or bedside chair)?: A Little Help needed to walk in hospital room?: A Little Help needed climbing 3-5 steps with a railing? : A Little 6 Click Score: 20    End of Session Equipment Utilized During Treatment: Gait belt Activity Tolerance: Patient tolerated treatment well Patient left: in chair;with chair alarm set Nurse Communication: Mobility status PT Visit Diagnosis: Unsteadiness on feet (R26.81);Muscle weakness (generalized) (M62.81);Difficulty in walking, not elsewhere classified (R26.2)    Time: 3267-1245 PT Time Calculation (min) (ACUTE ONLY): 41 min   Charges:   PT Evaluation $PT Eval Moderate Complexity: 1 Mod PT Treatments $Gait Training: 8-22 mins        Greggory Stallion, PT, DPT [phone]   Deshay Kirstein 08/26/2019, 5:08 PM

## 2019-08-27 DIAGNOSIS — N189 Chronic kidney disease, unspecified: Secondary | ICD-10-CM

## 2019-08-27 DIAGNOSIS — R1010 Upper abdominal pain, unspecified: Secondary | ICD-10-CM

## 2019-08-27 DIAGNOSIS — D509 Iron deficiency anemia, unspecified: Secondary | ICD-10-CM

## 2019-08-27 DIAGNOSIS — G9349 Other encephalopathy: Secondary | ICD-10-CM

## 2019-08-27 DIAGNOSIS — U071 COVID-19: Secondary | ICD-10-CM

## 2019-08-27 LAB — GLUCOSE, CAPILLARY
Glucose-Capillary: 195 mg/dL — ABNORMAL HIGH (ref 70–99)
Glucose-Capillary: 157 mg/dL — ABNORMAL HIGH (ref 70–99)
Glucose-Capillary: 113 mg/dL — ABNORMAL HIGH (ref 70–99)
Glucose-Capillary: 154 mg/dL — ABNORMAL HIGH (ref 70–99)

## 2019-08-27 LAB — BASIC METABOLIC PANEL
Anion gap: 7 (ref 5–15)
BUN: 32 mg/dL — ABNORMAL HIGH (ref 6–20)
CO2: 24 mmol/L (ref 22–32)
Calcium: 8.4 mg/dL — ABNORMAL LOW (ref 8.9–10.3)
Chloride: 108 mmol/L (ref 98–111)
Creatinine, Ser: 1.97 mg/dL — ABNORMAL HIGH (ref 0.44–1.00)
GFR calc Af Amer: 34 mL/min — ABNORMAL LOW (ref >60)
GFR calc non Af Amer: 29 mL/min — ABNORMAL LOW (ref >60)
Glucose, Bld: 155 mg/dL — ABNORMAL HIGH (ref 70–99)
Potassium: 4.5 mmol/L (ref 3.5–5.1)
Sodium: 139 mmol/L (ref 135–145)

## 2019-08-27 LAB — HEMOGLOBIN: Hemoglobin: 9 g/dL — ABNORMAL LOW (ref 12.0–15.0)

## 2019-08-27 MED ORDER — SENNOSIDES-DOCUSATE SODIUM 8.6-50 MG PO TABS: 1.0000 | ORAL_TABLET | Freq: Every day | ORAL | Status: DC

## 2019-08-27 MED ORDER — LIDOCAINE 5 % EX PTCH
1.0000 | MEDICATED_PATCH | CUTANEOUS | Status: DC
  Administered 2019-08-27 – 2019-08-28 (×2): 1 via TRANSDERMAL
  Filled 2019-08-27 (×2): qty 1

## 2019-08-27 MED ORDER — AMLODIPINE BESYLATE 5 MG PO TABS
5.0000 mg | ORAL_TABLET | Freq: Every day | ORAL | Status: DC
  Administered 2019-08-27 – 2019-08-28 (×2): 5 mg via ORAL
  Filled 2019-08-27 (×2): qty 1

## 2019-08-27 MED ORDER — BISACODYL 5 MG PO TBEC
10.0000 mg | DELAYED_RELEASE_TABLET | Freq: Once | ORAL | Status: AC
  Administered 2019-08-27: 21:00:00 10 mg via ORAL
  Filled 2019-08-27: qty 2

## 2019-08-27 MED ORDER — FERROUS SULFATE 325 (65 FE) MG PO TABS
325.0000 mg | ORAL_TABLET | Freq: Two times a day (BID) | ORAL | Status: DC
  Administered 2019-08-28: 08:00:00 325 mg via ORAL
  Filled 2019-08-27: qty 1

## 2019-08-27 NOTE — Progress Notes (Addendum)
Patient ID: Shelly Benjamin, female   DOB: [birth year], 49 y.o.   MRN: [id number] [facility] PROGRESS NOTE  Shelly Benjamin GMW:[id number] DOB: May 30, 1970 DOA: 08/25/2019 PCP: System, Pcp Not In  HPI/Subjective: Patient feeling like she did when she came in very fatigued.  This morning her blood pressure was very high.  She states she is eating okay.  Patient stated last night she had numbness in her left leg.  Objective: Vitals:   08/27/19 1114 08/27/19 1232  BP: (!) 184/82 (!) 166/79  Pulse: 76 82  Resp: (!) 21 19  Temp:  99.1 F (37.3 C)  SpO2: 99% 99%    Intake/Output Summary (Last 24 hours) at 08/27/2019 1256 Last data filed at 08/27/2019 1113 Gross per 24 hour  Intake 1179.23 ml  Output -  Net 1179.23 ml   Filed Weights   08/26/19 0033 08/26/19 0621 08/27/19 0426  Weight: 122.6 kg 134 kg 134.7 kg    ROS: Review of Systems  Constitutional: Positive for malaise/fatigue. Negative for fever.  Eyes: Negative for blurred vision.  Respiratory: Negative for cough and shortness of breath.   Cardiovascular: Negative for chest pain.  Gastrointestinal: Positive for nausea. Negative for abdominal pain, constipation, diarrhea and vomiting.  Genitourinary: Negative for dysuria.  Musculoskeletal: Negative for joint pain.  Neurological: Positive for weakness. Negative for dizziness.   Exam: Physical Exam  Constitutional: She is oriented to person, place, and time.  HENT:  Nose: No mucosal edema.  Mouth/Throat: No oropharyngeal exudate or posterior oropharyngeal edema.  Eyes: Pupils are equal, round, and reactive to light. Conjunctivae, EOM and lids are normal.  Neck: Carotid bruit is not present.  Cardiovascular: S1 normal and S2 normal. Exam reveals no gallop.  No murmur heard. Respiratory: No respiratory distress. She has decreased breath sounds in the right lower field and the left lower field. She has no wheezes. She has no rhonchi. She has no rales.  GI: Soft. Bowel  sounds are normal. There is no abdominal tenderness.  Musculoskeletal:     Right ankle: No swelling.     Left ankle: No swelling.  Lymphadenopathy:    She has no cervical adenopathy.  Neurological: She is alert and oriented to person, place, and time. No cranial nerve deficit.  Patient has a left shoulder frozen shoulder so cannot move that to well.  5 out of 5 strength bilateral lower  Skin: Skin is warm. No rash noted. Nails show no clubbing.  Psychiatric: She has a normal mood and affect.      Data Reviewed: Basic Metabolic Panel: Recent Labs  Lab 08/25/19 2018 08/26/19 0428 08/27/19 0842  NA 137 140 139  K 3.9 4.4 4.5  CL 105 109 108  CO2 23 25 24   GLUCOSE 172* 114* 155*  BUN 24* 21* 32*  CREATININE 1.57* 1.46* 1.97*  CALCIUM 8.9 8.8* 8.4*   Liver Function Tests: Recent Labs  Lab 08/25/19 2018  AST 12*  ALT 12  ALKPHOS 75  BILITOT 0.6  PROT 7.6  ALBUMIN 3.2*   Recent Labs  Lab 08/25/19 2018  LIPASE 43   CBC: Recent Labs  Lab 08/25/19 2018 08/26/19 0428 08/27/19 0842  WBC 9.3 8.1  --   NEUTROABS 5.4  --   --   HGB 9.5* 8.9* 9.0*  HCT 29.7* 28.6*  --   MCV 78.6* 80.6  --   PLT 299 266  --     CBG: Recent Labs  Lab 08/26/19 1352 08/26/19 1611 08/26/19 2104  08/27/19 0428 08/27/19 1211  GLUCAP 140* 145* 145* 195* 157*      Studies: EEG  Result Date: 08/26/2019 Thana Farreynolds, Leslie, MD     08/26/2019  2:08 PM ELECTROENCEPHALOGRAM REPORT Patient: Shelly Benjamin       Room #: 104A-AA EEG No. ID: [id number] Age: 49 y.o.        Sex: female Requesting Physician: Renae GlossWieting Report Date:  08/26/2019       Interpreting Physician: Thana FarrEYNOLDS, LESLIE History: Shelly ModyVeronica L Ferrufino is an 49 y.o. female with altered mental status Medications: Lipitor, Elavil, Insulin Conditions of Recording:  This is a 21 channel routine scalp EEG performed with bipolar and monopolar montages arranged in accordance to the international 10/20 system of electrode placement. One channel was  dedicated to EKG recording. The patient is in the awake, drowsy and asleep states. Description:  The waking background activity consists of a low voltage, symmetrical, fairly well organized, 9 Hz alpha activity, seen from the parieto-occipital and posterior temporal regions.  Low voltage fast activity, poorly organized, is seen anteriorly and is at times superimposed on more posterior regions.  A mixture of theta and alpha rhythms are seen from the central and temporal regions. The patient drowses with slowing to irregular, low voltage theta and beta activity.  The patient goes in to a light sleep with symmetrical sleep spindles, vertex central sharp transients and irregular slow activity. No epileptiform activity is noted.  Hyperventilation was not performed. Intermittent photic stimulation was performed but failed to illicit any change in the tracing. IMPRESSION: Normal electroencephalogram, awake, asleep and with activation procedures. There are no focal lateralizing or epileptiform features. Thana FarrLeslie Reynolds, MD Neurology [phone] 08/26/2019, 2:06 PM   CT Head Wo Contrast  Result Date: 08/25/2019 CLINICAL DATA:  Altered mental status EXAM: CT HEAD WITHOUT CONTRAST TECHNIQUE: Contiguous axial images were obtained from the base of the skull through the vertex without intravenous contrast. COMPARISON:  None. FINDINGS: Brain: No evidence of acute infarction, hemorrhage, hydrocephalus, extra-axial collection or mass lesion/mass effect. Vascular: No hyperdense vessel or unexpected calcification. Skull: Normal. Negative for fracture or focal lesion. Sinuses/Orbits: No acute finding. Other: None. IMPRESSION: No acute intracranial abnormality noted. Electronically Signed   By: Alcide CleverMark  Lukens M.D.   On: 08/25/2019 20:39   MR BRAIN WO CONTRAST  Result Date: 08/26/2019 CLINICAL DATA:  Altered mental status EXAM: MRI HEAD WITHOUT CONTRAST TECHNIQUE: Multiplanar, multiecho pulse sequences of the brain and surrounding  structures were obtained without intravenous contrast. COMPARISON:  None. FINDINGS: Brain: There is no acute infarction or intracranial hemorrhage. There is no intracranial mass, mass effect, or edema. There is no hydrocephalus or extra-axial fluid collection. Vascular: Major vessel flow voids at the skull base are preserved. Skull and upper cervical spine: Normal marrow signal is preserved. Sinuses/Orbits: Hypoplastic right maxillary sinus. Orbits are unremarkable. Other: Sella is unremarkable. Mastoid air cells are clear. Prominence of the adenoids. IMPRESSION: No evidence of recent infarction, hemorrhage, or mass. Electronically Signed   By: Guadlupe SpanishPraneil  Patel M.D.   On: 08/26/2019 12:32   CT Abdomen Pelvis W Contrast  Result Date: 08/25/2019 CLINICAL DATA:  Abdominal pain and weakness with black tarry stools EXAM: CT ABDOMEN AND PELVIS WITH CONTRAST TECHNIQUE: Multidetector CT imaging of the abdomen and pelvis was performed using the standard protocol following bolus administration of intravenous contrast. CONTRAST:  100mL OMNIPAQUE IOHEXOL 300 MG/ML  SOLN COMPARISON:  None. FINDINGS: Lower chest: Mild atelectatic changes are noted bilaterally. Hepatobiliary: Fatty infiltration of the liver is noted.  Gallbladder has been surgically removed. Pancreas: Unremarkable. No pancreatic ductal dilatation or surrounding inflammatory changes. Spleen: Normal in size without focal abnormality. Adrenals/Urinary Tract: Adrenal glands are within normal limits. Kidneys are well visualized bilaterally within normal enhancement pattern. No obstructive changes are seen. No renal calculi are noted. Bladder is well distended. Stomach/Bowel: No obstructive or inflammatory changes of the colon are seen. The appendix is within normal limits. No small bowel or gastric abnormality is noted. Vascular/Lymphatic: No significant vascular findings are present. No enlarged abdominal or pelvic lymph nodes. Reproductive: Uterus and bilateral  adnexa are unremarkable. Other: Small fat containing umbilical hernia is seen. No ascites is noted. Musculoskeletal: No acute or significant osseous findings. IMPRESSION: Chronic changes without acute abnormality. Electronically Signed   By: Alcide Clever M.D.   On: 08/25/2019 21:38   US Carotid Bilateral  Result Date: 08/26/2019 CLINICAL DATA:  49 year old female with a history of CVA EXAM: BILATERAL CAROTID DUPLEX ULTRASOUND TECHNIQUE: Wallace Cullens scale imaging, color Doppler and duplex ultrasound were performed of bilateral carotid and vertebral arteries in the neck. COMPARISON:  None. FINDINGS: Criteria: Quantification of carotid stenosis is based on velocity parameters that correlate the residual internal carotid diameter with NASCET-based stenosis levels, using the diameter of the distal internal carotid lumen as the denominator for stenosis measurement. The following velocity measurements were obtained: RIGHT ICA:  Systolic 86 cm/sec, Diastolic 41 cm/sec CCA:  75 cm/sec SYSTOLIC ICA/CCA RATIO:  1.6 ECA:  59 cm/sec LEFT ICA:  Systolic 76 cm/sec, Diastolic 33 cm/sec CCA:  76 cm/sec SYSTOLIC ICA/CCA RATIO:  1.5 ECA:  59 cm/sec Right Brachial SBP: Not acquired Left Brachial SBP: Not acquired RIGHT CAROTID ARTERY: No significant calcified disease of the right common carotid artery. Intermediate waveform maintained. Homogeneous plaque without significant calcifications at the right carotid bifurcation. Low resistance waveform of the right ICA. No significant tortuosity. RIGHT VERTEBRAL ARTERY: Antegrade flow with low resistance waveform. LEFT CAROTID ARTERY: No significant calcified disease of the left common carotid artery. Intermediate waveform maintained. Homogeneous plaque at the left carotid bifurcation without significant calcifications. Low resistance waveform of the left ICA. LEFT VERTEBRAL ARTERY:  Antegrade flow with low resistance waveform. IMPRESSION: Color duplex indicates minimal homogeneous plaque, with  no hemodynamically significant stenosis by duplex criteria in the extracranial cerebrovascular circulation. Signed, Yvone Neu. Reyne Dumas, RPVI [facility] Electronically Signed   By: Gilmer Mor D.O.   On: 08/26/2019 12:16   DG Chest Portable 1 View  Result Date: 08/25/2019 CLINICAL DATA:  Weakness and altered mental status. EXAM: PORTABLE CHEST 1 VIEW COMPARISON:  May 26, 2019 FINDINGS: Mildly decreased lung volumes are seen which is likely, in part, secondary to the degree of patient inspiration. Subsequent vascular crowding is seen without evidence of focal consolidation, pleural effusion or pneumothorax. The heart size and mediastinal contours are within normal limits. Mild degenerative changes seen throughout the thoracic spine. IMPRESSION: 1. Mildly decreased lung volumes with mild vascular crowding. 2. No acute or active cardiopulmonary disease. Electronically Signed   By: Aram Candela M.D.   On: 08/25/2019 20:25   ECHOCARDIOGRAM COMPLETE  Result Date: 08/26/2019    ECHOCARDIOGRAM REPORT   Patient Name:   Shelly Benjamin Date of Exam: 08/26/2019 Medical Rec #:  [id number]        Height:       64.0 in Accession #:    [id number]       Weight:       295.3 lb Date of  Birth:  12-Mar-1971         BSA:          2.309 m Patient Age:    56 years         BP:           141/94 mmHg Patient Gender: F                HR:           66 bpm. Exam Location:  [facility] Procedure: 2D Echo, Cardiac Doppler and Color Doppler Indications:     Syncope 780.2  History:         Patient has no prior history of Echocardiogram examinations.                  Risk Factors:Hypertension and Diabetes.  Sonographer:     Sherrie Sport RDCS (AE) Referring Phys:  [id number] Athena Masse Diagnosing Phys: Bartholome Bill MD IMPRESSIONS  1. Left ventricular ejection fraction, by estimation, is 55 to 60%. The left ventricle has normal function. The left ventricle has no regional wall motion  abnormalities. There is mild left ventricular hypertrophy. Left ventricular diastolic parameters are consistent with Grade I diastolic dysfunction (impaired relaxation).  2. Right ventricular systolic function is normal. The right ventricular size is normal. There is normal pulmonary artery systolic pressure.  3. The mitral valve is grossly normal. Trivial mitral valve regurgitation.  4. The aortic valve is tricuspid. Aortic valve regurgitation is not visualized. FINDINGS  Left Ventricle: Left ventricular ejection fraction, by estimation, is 55 to 60%. The left ventricle has normal function. The left ventricle has no regional wall motion abnormalities. The left ventricular internal cavity size was normal in size. There is  mild left ventricular hypertrophy. Left ventricular diastolic parameters are consistent with Grade I diastolic dysfunction (impaired relaxation). Right Ventricle: The right ventricular size is normal. No increase in right ventricular wall thickness. Right ventricular systolic function is normal. There is normal pulmonary artery systolic pressure. The tricuspid regurgitant velocity is 1.52 m/s, and  with an assumed right atrial pressure of 10 mmHg, the estimated right ventricular systolic pressure is 85.0 mmHg. Left Atrium: Left atrial size was normal in size. Right Atrium: Right atrial size was normal in size. Pericardium: There is no evidence of pericardial effusion. Mitral Valve: The mitral valve is grossly normal. Trivial mitral valve regurgitation. Tricuspid Valve: The tricuspid valve is grossly normal. Tricuspid valve regurgitation is trivial. Aortic Valve: The aortic valve is tricuspid. Aortic valve regurgitation is not visualized. Aortic valve mean gradient measures 3.0 mmHg. Aortic valve peak gradient measures 5.3 mmHg. Aortic valve area, by VTI measures 2.19 cm. Pulmonic Valve: The pulmonic valve was not well visualized. Pulmonic valve regurgitation is not visualized. Aorta: The aortic  root is normal in size and structure. IAS/Shunts: The interatrial septum was not assessed.  LEFT VENTRICLE PLAX 2D LVIDd:         4.37 cm  Diastology LVIDs:         3.13 cm  LV e' lateral:   6.85 cm/s LV PW:         1.25 cm  LV E/e' lateral: 11.3 LV IVS:        1.36 cm  LV e' medial:    6.31 cm/s LVOT diam:     2.00 cm  LV E/e' medial:  12.2 LV SV:         55 LV SV Index:   24 LVOT Area:  3.14 cm  RIGHT VENTRICLE RV S prime:     12.70 cm/s TAPSE (M-mode): 3.8 cm LEFT ATRIUM           Index       RIGHT ATRIUM           Index LA diam:      3.30 cm 1.43 cm/m  RA Area:     13.30 cm LA Vol (A2C): 68.6 ml 29.71 ml/m RA Volume:   29.30 ml  12.69 ml/m LA Vol (A4C): 24.6 ml 10.65 ml/m  AORTIC VALVE                   PULMONIC VALVE AV Area (Vmax):    2.36 cm    PV Vmax:        0.46 m/s AV Area (Vmean):   2.22 cm    PV Peak grad:   0.9 mmHg AV Area (VTI):     2.19 cm    RVOT Peak grad: 2 mmHg AV Vmax:           115.50 cm/s AV Vmean:          82.750 cm/s AV VTI:            0.250 m AV Peak Grad:      5.3 mmHg AV Mean Grad:      3.0 mmHg LVOT Vmax:         86.80 cm/s LVOT Vmean:        58.600 cm/s LVOT VTI:          0.175 m LVOT/AV VTI ratio: 0.70  AORTA Ao Root diam: 3.10 cm MITRAL VALVE               TRICUSPID VALVE MV Area (PHT): 3.58 cm    TR Peak grad:   9.2 mmHg MV Decel Time: 212 msec    TR Vmax:        152.00 cm/s MV E velocity: 77.10 cm/s MV A velocity: 98.50 cm/s  SHUNTS MV E/A ratio:  0.78        Systemic VTI:  0.18 m                            Systemic Diam: 2.00 cm Harold Hedge MD Electronically signed by Harold Hedge MD Signature Date/Time: 08/26/2019/1:01:00 PM    Final     Scheduled Meds: . amitriptyline  10 mg Oral QHS  . amLODipine  5 mg Oral Daily  . atorvastatin  40 mg Oral QHS  . enoxaparin (LOVENOX) injection  40 mg Subcutaneous Q12H  . insulin aspart  0-20 Units Subcutaneous TID WC  . insulin aspart  0-5 Units Subcutaneous QHS  . insulin glargine  20 Units Subcutaneous Daily  .  lidocaine  1 patch Transdermal Q24H  . lipase/protease/amylase  72,000 Units Oral TID with meals  . multivitamin with minerals  1 tablet Oral Daily  . oxyCODONE  5 mg Oral Once  . pantoprazole  40 mg Oral Daily  . prochlorperazine  10 mg Intravenous Once  . sodium chloride flush  3 mL Intravenous Q12H   Continuous Infusions: . sodium chloride 50 mL/hr at 08/27/19 1113    Assessment/Plan:  1. Acute metabolic encephalopathy likely secondary to post Covid infection.  Patient's MRI of the brain negative for stroke.  EEG negative.  Patient did well with PT. 2. Acute kidney injury on chronic kidney disease stage IIIb.  Creatinine worsened today up  to 1.97.  Continue IV fluids. 3. Accelerated hypertension this morning.  Started Norvasc.  Better blood pressure this afternoon. 4. Type 2 diabetes mellitus with hyperlipidemia on atorvastatin.  LDL 83.  Patient on sliding scale insulin and I gave a lower dose glargine insulin. 5. Abdominal pain.  CT scan of the abdomen negative and lipase normal range.  Continue pancreatic enzymes.  Patient felt better after Toradol.  I do not want to continue Toradol with her creatinine being where it is.  Trial of Lidoderm patch 6. Anemia, iron deficiency.  Start oral iron  Code Status:     Code Status Orders  (From admission, onward)         Start     Ordered   08/25/19 2244  Full code  Continuous     08/25/19 2247        Code Status History    This patient has a current code status but no historical code status.   Advance Care Planning Activity     Family Communication: Spoke with the patients sister on the phone. Disposition Plan: Patient feeling the same as she did when she came in very fatigued.  Since her creatinine worsened today I will continue IV fluids overnight and check a BMP tomorrow morning.  Hopefully will be able to discharge home with home health tomorrow morning.  Consultants:  Neurology  Time spent: 27 minutes  Idalis Hoelting  [employer]

## 2019-08-27 NOTE — Progress Notes (Signed)
Subjective: Patient reports still feeling fatigued. No new neurological complaints.  Objective: Current vital signs: BP (!) 179/125 (BP Location: Right Wrist)   Pulse 79   Temp 98.8 F (37.1 C) (Oral)   Resp 19   Ht 5\' 4"  (1.626 m)   Wt 134.7 kg   SpO2 98%   BMI 50.97 kg/m  Vital signs in last 24 hours: Temp:  [97.4 F (36.3 C)-98.8 F (37.1 C)] 98.8 F (37.1 C) (05/01 0740) Pulse Rate:  [72-80] 79 (05/01 0740) Resp:  [17-19] 19 (05/01 0740) BP: (101-179)/(43-125) 179/125 (05/01 0740) SpO2:  [98 %-100 %] 98 % (05/01 0740) Weight:  [134.7 kg] 134.7 kg (05/01 0426)  Intake/Output from previous day: No intake/output data recorded. Intake/Output this shift: Total I/O In: 1014.6 [I.V.:1014.6] Out: -  Nutritional status:  Diet Order            Diet Carb Modified Fluid consistency: Thin; Room service appropriate? Yes  Diet effective now              Neurologic Exam: Mental Status: Alert, oriented, thought content appropriate.  Speech fluent without evidence of aphasia.  Able to follow 3 step commands without difficulty. Cranial Nerves: II: Discs flat bilaterally; Visual fields grossly normal, pupils equal, round, reactive to light and accommodation III,IV, VI: ptosis not present, extra-ocular motions intact bilaterally V,VII: smile symmetric, facial light touch sensation normal bilaterally VIII: hearing normal bilaterally IX,X: gag reflex present XI: bilateral shoulder shrug XII: midline tongue extension Motor: Right : Upper extremity   5/5    Left:     Upper extremity   5/5  Lower extremity   5/5     Lower extremity   5/5 Tone and bulk:normal tone throughout; no atrophy noted Sensory: Pinprick and light touch intact throughout, bilaterally  Lab Results: Basic Metabolic Panel: Recent Labs  Lab 08/25/19 2018 08/26/19 0428 08/27/19 0842  NA 137 140 139  K 3.9 4.4 4.5  CL 105 109 108  CO2 23 25 24   GLUCOSE 172* 114* 155*  BUN 24* 21* 32*  CREATININE 1.57*  1.46* 1.97*  CALCIUM 8.9 8.8* 8.4*    Liver Function Tests: Recent Labs  Lab 08/25/19 2018  AST 12*  ALT 12  ALKPHOS 75  BILITOT 0.6  PROT 7.6  ALBUMIN 3.2*   Recent Labs  Lab 08/25/19 2018  LIPASE 43   No results for input(s): AMMONIA in the last 168 hours.  CBC: Recent Labs  Lab 08/25/19 2018 08/26/19 0428 08/27/19 0842  WBC 9.3 8.1  --   NEUTROABS 5.4  --   --   HGB 9.5* 8.9* 9.0*  HCT 29.7* 28.6*  --   MCV 78.6* 80.6  --   PLT 299 266  --     Cardiac Enzymes: Recent Labs  Lab 08/26/19 0428  CKTOTAL 62    Lipid Panel: Recent Labs  Lab 08/26/19 0428  CHOL 145  TRIG 146  HDL 33*  CHOLHDL 4.4  VLDL 29  LDLCALC 83    CBG: Recent Labs  Lab 08/26/19 0830 08/26/19 1352 08/26/19 1611 08/26/19 2104 08/27/19 0428  GLUCAP 98 140* 145* 145* 195*    Microbiology: Results for orders placed or performed in visit on 05/25/19  Novel Coronavirus, NAA ([facility])     Status: None   Collection Time: 05/25/19  3:00 PM   Specimen: Nasopharyngeal(NP) swabs in vial transport medium   NASOPHARYNGE  TESTING  Result Value Ref Range Status   SARS-CoV-2, NAA Not Detected Not  Detected Final    Comment: This nucleic acid amplification test was developed and its performance characteristics determined by [employer]. Nucleic acid amplification tests include RT-PCR and TMA. This test has not been FDA cleared or approved. This test has been authorized by FDA under an Emergency Use Authorization (EUA). This test is only authorized for the duration of time the declaration that circumstances exist justifying the authorization of the emergency use of in vitro diagnostic tests for detection of SARS-CoV-2 virus and/or diagnosis of COVID-19 infection under section 564(b)(1) of the Act, 21 U.S.C. 161WRU-0(A) (1), unless the authorization is terminated or revoked sooner. When diagnostic testing is negative, the possibility of a false negative result should be  considered in the context of a patient's recent exposures and the presence of clinical signs and symptoms consistent with COVID-19. An individual without symptoms of COVID-19 and who is not shedding SARS-CoV-2 virus wo uld expect to have a negative (not detected) result in this assay.     Coagulation Studies: No results for input(s): LABPROT, INR in the last 72 hours.  Imaging: EEG  Result Date: 08/26/2019 Thana Farr, MD     08/26/2019  2:08 PM ELECTROENCEPHALOGRAM REPORT Patient: Shelly Benjamin       Room #: 104A-AA EEG No. ID: [id number] Age: 49 y.o.        Sex: female Requesting Physician: Renae Gloss Report Date:  08/26/2019       Interpreting Physician: Thana Farr History: Shelly Benjamin is an 49 y.o. female with altered mental status Medications: Lipitor, Elavil, Insulin Conditions of Recording:  This is a 21 channel routine scalp EEG performed with bipolar and monopolar montages arranged in accordance to the international 10/20 system of electrode placement. One channel was dedicated to EKG recording. The patient is in the awake, drowsy and asleep states. Description:  The waking background activity consists of a low voltage, symmetrical, fairly well organized, 9 Hz alpha activity, seen from the parieto-occipital and posterior temporal regions.  Low voltage fast activity, poorly organized, is seen anteriorly and is at times superimposed on more posterior regions.  A mixture of theta and alpha rhythms are seen from the central and temporal regions. The patient drowses with slowing to irregular, low voltage theta and beta activity.  The patient goes in to a light sleep with symmetrical sleep spindles, vertex central sharp transients and irregular slow activity. No epileptiform activity is noted.  Hyperventilation was not performed. Intermittent photic stimulation was performed but failed to illicit any change in the tracing. IMPRESSION: Normal electroencephalogram, awake, asleep and with  activation procedures. There are no focal lateralizing or epileptiform features. Thana Farr, MD Neurology [phone] 08/26/2019, 2:06 PM   CT Head Wo Contrast  Result Date: 08/25/2019 CLINICAL DATA:  Altered mental status EXAM: CT HEAD WITHOUT CONTRAST TECHNIQUE: Contiguous axial images were obtained from the base of the skull through the vertex without intravenous contrast. COMPARISON:  None. FINDINGS: Brain: No evidence of acute infarction, hemorrhage, hydrocephalus, extra-axial collection or mass lesion/mass effect. Vascular: No hyperdense vessel or unexpected calcification. Skull: Normal. Negative for fracture or focal lesion. Sinuses/Orbits: No acute finding. Other: None. IMPRESSION: No acute intracranial abnormality noted. Electronically Signed   By: Alcide Clever M.D.   On: 08/25/2019 20:39   MR BRAIN WO CONTRAST  Result Date: 08/26/2019 CLINICAL DATA:  Altered mental status EXAM: MRI HEAD WITHOUT CONTRAST TECHNIQUE: Multiplanar, multiecho pulse sequences of the brain and surrounding structures were obtained without intravenous contrast. COMPARISON:  None. FINDINGS: Brain: There  is no acute infarction or intracranial hemorrhage. There is no intracranial mass, mass effect, or edema. There is no hydrocephalus or extra-axial fluid collection. Vascular: Major vessel flow voids at the skull base are preserved. Skull and upper cervical spine: Normal marrow signal is preserved. Sinuses/Orbits: Hypoplastic right maxillary sinus. Orbits are unremarkable. Other: Sella is unremarkable. Mastoid air cells are clear. Prominence of the adenoids. IMPRESSION: No evidence of recent infarction, hemorrhage, or mass. Electronically Signed   By: Macy Mis M.D.   On: 08/26/2019 12:32   CT Abdomen Pelvis W Contrast  Result Date: 08/25/2019 CLINICAL DATA:  Abdominal pain and weakness with black tarry stools EXAM: CT ABDOMEN AND PELVIS WITH CONTRAST TECHNIQUE: Multidetector CT imaging of the abdomen and pelvis  was performed using the standard protocol following bolus administration of intravenous contrast. CONTRAST:  144mL OMNIPAQUE IOHEXOL 300 MG/ML  SOLN COMPARISON:  None. FINDINGS: Lower chest: Mild atelectatic changes are noted bilaterally. Hepatobiliary: Fatty infiltration of the liver is noted. Gallbladder has been surgically removed. Pancreas: Unremarkable. No pancreatic ductal dilatation or surrounding inflammatory changes. Spleen: Normal in size without focal abnormality. Adrenals/Urinary Tract: Adrenal glands are within normal limits. Kidneys are well visualized bilaterally within normal enhancement pattern. No obstructive changes are seen. No renal calculi are noted. Bladder is well distended. Stomach/Bowel: No obstructive or inflammatory changes of the colon are seen. The appendix is within normal limits. No small bowel or gastric abnormality is noted. Vascular/Lymphatic: No significant vascular findings are present. No enlarged abdominal or pelvic lymph nodes. Reproductive: Uterus and bilateral adnexa are unremarkable. Other: Small fat containing umbilical hernia is seen. No ascites is noted. Musculoskeletal: No acute or significant osseous findings. IMPRESSION: Chronic changes without acute abnormality. Electronically Signed   By: Inez Catalina M.D.   On: 08/25/2019 21:38   US Carotid Bilateral  Result Date: 08/26/2019 CLINICAL DATA:  49 year old female with a history of CVA EXAM: BILATERAL CAROTID DUPLEX ULTRASOUND TECHNIQUE: Pearline Cables scale imaging, color Doppler and duplex ultrasound were performed of bilateral carotid and vertebral arteries in the neck. COMPARISON:  None. FINDINGS: Criteria: Quantification of carotid stenosis is based on velocity parameters that correlate the residual internal carotid diameter with NASCET-based stenosis levels, using the diameter of the distal internal carotid lumen as the denominator for stenosis measurement. The following velocity measurements were obtained: RIGHT ICA:   Systolic 86 cm/sec, Diastolic 41 cm/sec CCA:  75 cm/sec SYSTOLIC ICA/CCA RATIO:  1.6 ECA:  59 cm/sec LEFT ICA:  Systolic 76 cm/sec, Diastolic 33 cm/sec CCA:  76 cm/sec SYSTOLIC ICA/CCA RATIO:  1.5 ECA:  59 cm/sec Right Brachial SBP: Not acquired Left Brachial SBP: Not acquired RIGHT CAROTID ARTERY: No significant calcified disease of the right common carotid artery. Intermediate waveform maintained. Homogeneous plaque without significant calcifications at the right carotid bifurcation. Low resistance waveform of the right ICA. No significant tortuosity. RIGHT VERTEBRAL ARTERY: Antegrade flow with low resistance waveform. LEFT CAROTID ARTERY: No significant calcified disease of the left common carotid artery. Intermediate waveform maintained. Homogeneous plaque at the left carotid bifurcation without significant calcifications. Low resistance waveform of the left ICA. LEFT VERTEBRAL ARTERY:  Antegrade flow with low resistance waveform. IMPRESSION: Color duplex indicates minimal homogeneous plaque, with no hemodynamically significant stenosis by duplex criteria in the extracranial cerebrovascular circulation. Signed, Dulcy Fanny. Dellia Nims, RPVI [facility] Electronically Signed   By: Corrie Mckusick D.O.   On: 08/26/2019 12:16   DG Chest Portable 1 View  Result Date: 08/25/2019 CLINICAL DATA:  Weakness and altered mental status. EXAM: PORTABLE CHEST 1 VIEW COMPARISON:  May 26, 2019 FINDINGS: Mildly decreased lung volumes are seen which is likely, in part, secondary to the degree of patient inspiration. Subsequent vascular crowding is seen without evidence of focal consolidation, pleural effusion or pneumothorax. The heart size and mediastinal contours are within normal limits. Mild degenerative changes seen throughout the thoracic spine. IMPRESSION: 1. Mildly decreased lung volumes with mild vascular crowding. 2. No acute or active cardiopulmonary disease.  Electronically Signed   By: Aram Candela M.D.   On: 08/25/2019 20:25   ECHOCARDIOGRAM COMPLETE  Result Date: 08/26/2019    ECHOCARDIOGRAM REPORT   Patient Name:   Shelly Benjamin Date of Exam: 08/26/2019 Medical Rec #:  [id number]        Height:       64.0 in Accession #:    [id number]       Weight:       295.3 lb Date of Birth:  08/19/1970         BSA:          2.309 m Patient Age:    48 years         BP:           141/94 mmHg Patient Gender: F                HR:           66 bpm. Exam Location:  [facility] Procedure: 2D Echo, Cardiac Doppler and Color Doppler Indications:     Syncope 780.2  History:         Patient has no prior history of Echocardiogram examinations.                  Risk Factors:Hypertension and Diabetes.  Sonographer:     Cristela Blue RDCS (AE) Referring Phys:  [id number] Andris Baumann Diagnosing Phys: Harold Hedge MD IMPRESSIONS  1. Left ventricular ejection fraction, by estimation, is 55 to 60%. The left ventricle has normal function. The left ventricle has no regional wall motion abnormalities. There is mild left ventricular hypertrophy. Left ventricular diastolic parameters are consistent with Grade I diastolic dysfunction (impaired relaxation).  2. Right ventricular systolic function is normal. The right ventricular size is normal. There is normal pulmonary artery systolic pressure.  3. The mitral valve is grossly normal. Trivial mitral valve regurgitation.  4. The aortic valve is tricuspid. Aortic valve regurgitation is not visualized. FINDINGS  Left Ventricle: Left ventricular ejection fraction, by estimation, is 55 to 60%. The left ventricle has normal function. The left ventricle has no regional wall motion abnormalities. The left ventricular internal cavity size was normal in size. There is  mild left ventricular hypertrophy. Left ventricular diastolic parameters are consistent with Grade I diastolic dysfunction (impaired relaxation). Right Ventricle: The right ventricular size is normal.  No increase in right ventricular wall thickness. Right ventricular systolic function is normal. There is normal pulmonary artery systolic pressure. The tricuspid regurgitant velocity is 1.52 m/s, and  with an assumed right atrial pressure of 10 mmHg, the estimated right ventricular systolic pressure is 19.2 mmHg. Left Atrium: Left atrial size was normal in size. Right Atrium: Right atrial size was normal in size. Pericardium: There is no evidence of pericardial effusion. Mitral Valve: The mitral valve is grossly normal. Trivial mitral valve regurgitation. Tricuspid Valve: The tricuspid valve is grossly normal. Tricuspid valve regurgitation is trivial. Aortic Valve: The aortic valve is tricuspid. Aortic valve regurgitation is  not visualized. Aortic valve mean gradient measures 3.0 mmHg. Aortic valve peak gradient measures 5.3 mmHg. Aortic valve area, by VTI measures 2.19 cm. Pulmonic Valve: The pulmonic valve was not well visualized. Pulmonic valve regurgitation is not visualized. Aorta: The aortic root is normal in size and structure. IAS/Shunts: The interatrial septum was not assessed.  LEFT VENTRICLE PLAX 2D LVIDd:         4.37 cm  Diastology LVIDs:         3.13 cm  LV e' lateral:   6.85 cm/s LV PW:         1.25 cm  LV E/e' lateral: 11.3 LV IVS:        1.36 cm  LV e' medial:    6.31 cm/s LVOT diam:     2.00 cm  LV E/e' medial:  12.2 LV SV:         55 LV SV Index:   24 LVOT Area:     3.14 cm  RIGHT VENTRICLE RV S prime:     12.70 cm/s TAPSE (M-mode): 3.8 cm LEFT ATRIUM           Index       RIGHT ATRIUM           Index LA diam:      3.30 cm 1.43 cm/m  RA Area:     13.30 cm LA Vol (A2C): 68.6 ml 29.71 ml/m RA Volume:   29.30 ml  12.69 ml/m LA Vol (A4C): 24.6 ml 10.65 ml/m  AORTIC VALVE                   PULMONIC VALVE AV Area (Vmax):    2.36 cm    PV Vmax:        0.46 m/s AV Area (Vmean):   2.22 cm    PV Peak grad:   0.9 mmHg AV Area (VTI):     2.19 cm    RVOT Peak grad: 2 mmHg AV Vmax:           115.50  cm/s AV Vmean:          82.750 cm/s AV VTI:            0.250 m AV Peak Grad:      5.3 mmHg AV Mean Grad:      3.0 mmHg LVOT Vmax:         86.80 cm/s LVOT Vmean:        58.600 cm/s LVOT VTI:          0.175 m LVOT/AV VTI ratio: 0.70  AORTA Ao Root diam: 3.10 cm MITRAL VALVE               TRICUSPID VALVE MV Area (PHT): 3.58 cm    TR Peak grad:   9.2 mmHg MV Decel Time: 212 msec    TR Vmax:        152.00 cm/s MV E velocity: 77.10 cm/s MV A velocity: 98.50 cm/s  SHUNTS MV E/A ratio:  0.78        Systemic VTI:  0.18 m                            Systemic Diam: 2.00 cm Harold Hedge MD Electronically signed by Harold Hedge MD Signature Date/Time: 08/26/2019/1:01:00 PM    Final     Medications:  I have reviewed the patient's current medications. Scheduled: . amitriptyline  10 mg Oral QHS  .  amLODipine  5 mg Oral Daily  . atorvastatin  40 mg Oral QHS  . enoxaparin (LOVENOX) injection  40 mg Subcutaneous Q12H  . insulin aspart  0-20 Units Subcutaneous TID WC  . insulin aspart  0-5 Units Subcutaneous QHS  . insulin glargine  20 Units Subcutaneous Daily  . lidocaine  1 patch Transdermal Q24H  . lipase/protease/amylase  72,000 Units Oral TID with meals  . multivitamin with minerals  1 tablet Oral Daily  . oxyCODONE  5 mg Oral Once  . pantoprazole  40 mg Oral Daily  . prochlorperazine  10 mg Intravenous Once  . sodium chloride flush  3 mL Intravenous Q12H    Assessment/Plan: 49 y.o. female with medical history significant fordiabetes, hypertension obesity, COVID-19 positive on 08/09/2019, who started having vomiting and diarrhea with abdominal paina coupledays agoandwas brought into the emergency room for evaluation of altered mental status.  Patient complaining of fatigue and having difficulty moving a round.  Patient reports having these issues since her diagnosis.  No focality no neurological examination.  Cognitive presentation likely secondary to COVID.  UDS negative. B12, TSH and CK are normal.  B1  is pending.  MRI of the brain personally reviewed and shows no acute changes.  EEG unremarkable.     Recommendations: 1. No further neurologic intervention is recommended at this time.  If further questions arise, please call or page at that time.  Thank you for allowing neurology to participate in the care of this patient.  Case discussed with Dr. Renae GlossWieting   LOS: 1 day   Thana FarrLeslie Jamilet Ambroise, MD Neurology [phone] 08/27/2019  10:17 AM

## 2019-08-28 DIAGNOSIS — I10 Essential (primary) hypertension: Secondary | ICD-10-CM

## 2019-08-28 LAB — BASIC METABOLIC PANEL
Anion gap: 3 — ABNORMAL LOW (ref 5–15)
BUN: 29 mg/dL — ABNORMAL HIGH (ref 6–20)
CO2: 25 mmol/L (ref 22–32)
Calcium: 8.3 mg/dL — ABNORMAL LOW (ref 8.9–10.3)
Chloride: 110 mmol/L (ref 98–111)
Creatinine, Ser: 1.47 mg/dL — ABNORMAL HIGH (ref 0.44–1.00)
GFR calc Af Amer: 48 mL/min — ABNORMAL LOW (ref >60)
GFR calc non Af Amer: 41 mL/min — ABNORMAL LOW (ref >60)
Glucose, Bld: 161 mg/dL — ABNORMAL HIGH (ref 70–99)
Potassium: 4.4 mmol/L (ref 3.5–5.1)
Sodium: 138 mmol/L (ref 135–145)

## 2019-08-28 LAB — GLUCOSE, CAPILLARY
Glucose-Capillary: 149 mg/dL — ABNORMAL HIGH (ref 70–99)
Glucose-Capillary: 106 mg/dL — ABNORMAL HIGH (ref 70–99)

## 2019-08-28 MED ORDER — ACETAMINOPHEN 325 MG PO TABS: 650.0000 mg | ORAL_TABLET | Freq: Four times a day (QID) | ORAL | Status: AC | PRN

## 2019-08-28 MED ORDER — INSULIN GLARGINE 100 UNIT/ML ~~LOC~~ SOLN
20.0000 [IU] | Freq: Every day | SUBCUTANEOUS | Status: DC
  Filled 2019-08-28: qty 0.2

## 2019-08-28 MED ORDER — INSULIN GLARGINE 100 UNIT/ML ~~LOC~~ SOLN: 20.0000 [IU] | Freq: Every day | SUBCUTANEOUS | 11 refills | Status: AC

## 2019-08-28 MED ORDER — MAGNESIUM SULFATE 2 GM/50ML IV SOLN
2.0000 g | Freq: Once | INTRAVENOUS | Status: AC
  Administered 2019-08-28: 2 g via INTRAVENOUS
  Filled 2019-08-28: qty 50

## 2019-08-28 MED ORDER — AMLODIPINE BESYLATE 5 MG PO TABS: 5.0000 mg | ORAL_TABLET | Freq: Every day | ORAL | 0 refills | Status: DC

## 2019-08-28 NOTE — Discharge Summary (Signed)
[facility] [place] at [facility]   PATIENT NAME: Shelly Benjamin    MR#:  [id number]  DATE OF BIRTH:  28-Jul-1970  DATE OF ADMISSION:  08/25/2019 ADMITTING PHYSICIAN: Alford Highland, MD  DATE OF DISCHARGE: 08/28/2019 12:54 PM  PRIMARY CARE PHYSICIAN: Dr Chestine Spore    ADMISSION DIAGNOSIS:  Altered mental status [R41.82] Generalized abdominal pain [R10.84] Vomiting and diarrhea [R11.10, R19.7] Altered mental status, unspecified altered mental status type [R41.82] Acute metabolic encephalopathy [G93.41]  DISCHARGE DIAGNOSIS:  Principal Problem:   Acute metabolic encephalopathy Active Problems:   CKD (chronic kidney disease) stage 3, GFR 30-59 ml/min   Acute gastroenteritis   Type 2 diabetes mellitus with hyperlipidemia (HCC)   Essential hypertension   Left-sided weakness   Personal history of covid-19  08/09/19   Altered mental status   Morbid obesity with BMI of 50.0-59.9, adult (HCC)   Generalized abdominal pain   Acute kidney injury superimposed on CKD (HCC)   Encephalopathy due to COVID-19 virus   Pain of upper abdomen   Iron deficiency anemia   SECONDARY DIAGNOSIS:   Past Medical History:  Diagnosis Date  . Diabetes mellitus without complication (HCC)   . GERD (gastroesophageal reflux disease)   . Hyperlipidemia   . Hypertension     HOSPITAL COURSE:   1.  Acute metabolic encephalopathy secondary to post Covid infection.  Patient complained of left-sided weakness and headache.  The patient's MRI of the brain was negative for stroke.  EEG negative.  The patient did well with physical therapy.  Case discussed with neurology sometimes this metabolic encephalopathy secondary to Covid can last months or even up to a year.  She will check to see when her second Covid vaccination should be.  To me her mental status is improved since when she came in. 2.  Acute kidney injury on chronic kidney disease stage IIIb.  Creatinine fluctuated during the hospital course.   It was as high as 1.97 and upon discharge came down to 1.47. 3.  Essential hypertension.  Initially we held her medications and gave IV fluids.  Blood pressure started to increase and was very high yesterday.  We started Norvasc with better blood pressure control.  Continue to hold lisinopril and hydrochlorothiazide at this point. 4.  Type 2 diabetes mellitus with hyperlipidemia on atorvastatin.  LDL 83.  I did prescribe lower dose glargine insulin 20 units and that can be given at night. 5.  Chronic abdominal pain.  CT scan of the abdomen negative and lipase normal range.  Continue pancreatic enzymes.  The patient did do better after Toradol which makes the pain likely inflammatory in nature.  She has an allergy to Naprosyn and cannot give long-term anti-inflammatory secondary to her chronic kidney disease.  I tried a Lidoderm patch but that did not help. 6.  Iron deficiency anemia.  Continue iron.  Patient stated she had a colonoscopy and endoscopy a couple years ago which I do not have record of this.  Follow-up with PMD  DISCHARGE CONDITIONS:   Satisfactory  CONSULTS OBTAINED:  Treatment Team:  Kym Groom, MD  DRUG ALLERGIES:   Allergies  Allergen Reactions  . Morphine And Related Itching  . Naproxen Swelling    DISCHARGE MEDICATIONS:   Allergies as of 08/28/2019      Reactions   Morphine And Related Itching   Naproxen Swelling      Medication List    STOP taking these medications   hydrochlorothiazide 25 MG tablet Commonly known  as: HYDRODIURIL   insulin glargine 100 unit/mL Sopn Commonly known as: LANTUS Replaced by: insulin glargine 100 UNIT/ML injection   lisinopril 5 MG tablet Commonly known as: ZESTRIL     TAKE these medications   acetaminophen 325 MG tablet Commonly known as: TYLENOL Take 2 tablets (650 mg total) by mouth every 6 (six) hours as needed for mild pain (or Fever >/= 101).   Alive Womens 50+ Tabs Take 1 tablet by mouth daily.    amitriptyline 10 MG tablet Commonly known as: ELAVIL Take 10 mg by mouth at bedtime.   amLODipine 5 MG tablet Commonly known as: NORVASC Take 1 tablet (5 mg total) by mouth daily.   atorvastatin 40 MG tablet Commonly known as: LIPITOR Take 1 tablet by mouth at bedtime.   ferrous sulfate 325 (65 FE) MG EC tablet Take 325 mg by mouth in the morning and at bedtime.   insulin glargine 100 UNIT/ML injection Commonly known as: LANTUS Inject 0.2 mLs (20 Units total) into the skin at bedtime. Replaces: insulin glargine 100 unit/mL Sopn   Pancrelipase (Lip-Prot-Amyl) 24000-76000 units Cpep Take 1-2 capsules by mouth with breakfast, with lunch, and with evening meal. Take 2 capsules with full meals. Take 1 capsule with half meals or snacks. Do not take if not eating.   Pancrelipase (Lip-Prot-Amyl) 24000-76000 units Cpep Take 1 capsule by mouth with snacks.   pantoprazole 40 MG tablet Commonly known as: PROTONIX Take 40 mg by mouth daily.        DISCHARGE INSTRUCTIONS:   Follow-up PMD as scheduled on Thursday  If you experience worsening of your admission symptoms, develop shortness of breath, life threatening emergency, suicidal or homicidal thoughts you must seek medical attention immediately by calling 911 or calling your MD immediately  if symptoms less severe.  You Must read complete instructions/literature along with all the possible adverse reactions/side effects for all the Medicines you take and that have been prescribed to you. Take any new Medicines after you have completely understood and accept all the possible adverse reactions/side effects.   Please note  You were cared for by a hospitalist during your hospital stay. If you have any questions about your discharge medications or the care you received while you were in the hospital after you are discharged, you can call the unit and asked to speak with the hospitalist on call if the hospitalist that took care of you is  not available. Once you are discharged, your primary care physician will handle any further medical issues. Please note that NO REFILLS for any discharge medications will be authorized once you are discharged, as it is imperative that you return to your primary care physician (or establish a relationship with a primary care physician if you do not have one) for your aftercare needs so that they can reassess your need for medications and monitor your lab values.    Today   CHIEF COMPLAINT:   Chief Complaint  Patient presents with  . Altered Mental Status  . Weakness    HISTORY OF PRESENT ILLNESS:  Shelly Benjamin  is a 49 y.o. female came in with altered mental status and weakness   VITAL SIGNS:  Blood pressure (!) 153/93, pulse 78, temperature 98.4 F (36.9 C), temperature source Oral, resp. rate 16, height 5\' 4"  (1.626 m), weight (!) 137.4 kg, SpO2 100 %.   PHYSICAL EXAMINATION:  GENERAL:  49 y.o.-year-old patient lying in the bed with no acute distress.  EYES: Pupils equal, round, reactive  to light and accommodation. No scleral icterus. HEENT: Head atraumatic, normocephalic. Oropharynx and nasopharynx clear.  LUNGS: Normal breath sounds bilaterally, no wheezing, rales,rhonchi or crepitation. No use of accessory muscles of respiration.  CARDIOVASCULAR: S1, S2 normal. No murmurs, rubs, or gallops.  ABDOMEN: Soft, non-tender, non-distended. Bowel sounds present. No organomegaly or mass.  EXTREMITIES: Trace pedal edema.  No cyanosis, or clubbing.  NEUROLOGIC: Cranial nerves II through XII are intact. Muscle strength 5/5 in all extremities. Sensation intact. Gait not checked.  PSYCHIATRIC: The patient is alert and oriented x 3.  SKIN: No obvious rash, lesion, or ulcer.   DATA REVIEW:   CBC Recent Labs  Lab 08/26/19 0428 08/26/19 0428 08/27/19 0842  WBC 8.1  --   --   HGB 8.9*   < > 9.0*  HCT 28.6*  --   --   PLT 266  --   --    < > = values in this interval not displayed.     Chemistries  Recent Labs  Lab 08/25/19 2018 08/26/19 0428 08/28/19 0457  NA 137   < > 138  K 3.9   < > 4.4  CL 105   < > 110  CO2 23   < > 25  GLUCOSE 172*   < > 161*  BUN 24*   < > 29*  CREATININE 1.57*   < > 1.47*  CALCIUM 8.9   < > 8.3*  AST 12*  --   --   ALT 12  --   --   ALKPHOS 75  --   --   BILITOT 0.6  --   --    < > = values in this interval not displayed.     Microbiology Results  Results for orders placed or performed in visit on 05/25/19  Novel Coronavirus, NAA ([facility])     Status: None   Collection Time: 05/25/19  3:00 PM   Specimen: Nasopharyngeal(NP) swabs in vial transport medium   NASOPHARYNGE  TESTING  Result Value Ref Range Status   SARS-CoV-2, NAA Not Detected Not Detected Final    Comment: This nucleic acid amplification test was developed and its performance characteristics determined by [employer]. Nucleic acid amplification tests include RT-PCR and TMA. This test has not been FDA cleared or approved. This test has been authorized by FDA under an Emergency Use Authorization (EUA). This test is only authorized for the duration of time the declaration that circumstances exist justifying the authorization of the emergency use of in vitro diagnostic tests for detection of SARS-CoV-2 virus and/or diagnosis of COVID-19 infection under section 564(b)(1) of the Act, 21 U.S.C. 678LFY-1(O) (1), unless the authorization is terminated or revoked sooner. When diagnostic testing is negative, the possibility of a false negative result should be considered in the context of a patient's recent exposures and the presence of clinical signs and symptoms consistent with COVID-19. An individual without symptoms of COVID-19 and who is not shedding SARS-CoV-2 virus wo uld expect to have a negative (not detected) result in this assay.      Management plans discussed with the patient, family and they are in agreement.  CODE STATUS:     Code  Status Orders  (From admission, onward)         Start     Ordered   08/25/19 2244  Full code  Continuous     08/25/19 2247        Code Status History    This patient has a  current code status but no historical code status.   Advance Care Planning Activity      TOTAL TIME TAKING CARE OF THIS PATIENT: 35 minutes.    Alford Highland M.D on 08/28/2019 at 3:53 PM  Between 7am to 6pm - Pager - [phone]  After 6pm go to [email or web address] - password [email or web address]  [facility]  CC: Primary care physician; System, Pcp Not In

## 2019-08-28 NOTE — Progress Notes (Signed)
Patient adequate for discharge. AVS printed and reviewed with patient. No questions at this time.

## 2019-08-28 NOTE — Progress Notes (Signed)
Patient ID: Shelly Benjamin   [facility] [place] at [facility] was admitted to the Hospital on 08/25/2019 and Discharged  08/28/2019 and should be excused from work/school   for 10  days starting 08/25/2019 , may return to work/school without any restrictions.  Alford Highland M.D on 08/28/2019,at 8:34 AM  [facility] [place] at [facility]

## 2019-08-28 NOTE — TOC Transition Note (Signed)
Transition of Care [facility]) - CM/SW Discharge Note   Patient Details  Name: Shelly Benjamin MRN: [id number] Date of Birth: 11/06/1970  [facility] CM/SW Contact:  Maud Deed, LCSW Phone Number: 08/28/2019, 12:06 PM   Clinical Narrative:    Pt medically stable for discharge per MD. CSW contacted pt to notify her of PT and OT recommendations. Pt declined HH services and stated "I think I will be alright". CSW asked about DME and pt refused DME.   Final next level of care: Home/Self Care Barriers to Discharge: No Barriers Identified   Patient Goals and CMS Choice        Discharge Placement                Patient to be transferred to facility by: Sister Name of family member notified: Marylene Land Patient and family notified of of transfer: 08/28/19  Discharge Plan and Services                                     Social Determinants of Health (SDOH) Interventions     Readmission Risk Interventions No flowsheet data found.

## 2019-08-29 LAB — GLUCOSE, CAPILLARY: Glucose-Capillary: 126 mg/dL — ABNORMAL HIGH (ref 70–99)

## 2019-09-01 LAB — VITAMIN B1: Vitamin B1 (Thiamine): 152.4 nmol/L (ref 66.5–200.0)

## 2020-05-01 ENCOUNTER — Ambulatory Visit (INDEPENDENT_AMBULATORY_CARE_PROVIDER_SITE_OTHER): Payer: BC Managed Care – PPO

## 2020-05-01 ENCOUNTER — Other Ambulatory Visit: Payer: Self-pay

## 2020-05-01 ENCOUNTER — Ambulatory Visit
Admission: EM | Admit: 2020-05-01 | Discharge: 2020-05-01 | Disposition: A | Discharge status: Home / Self Care | Payer: BC Managed Care – PPO | Attending: Family Medicine | Admitting: Family Medicine

## 2020-05-01 DIAGNOSIS — E1165 Type 2 diabetes mellitus with hyperglycemia: Secondary | ICD-10-CM | POA: Insufficient documentation

## 2020-05-01 DIAGNOSIS — U071 COVID-19: Secondary | ICD-10-CM | POA: Diagnosis not present

## 2020-05-01 DIAGNOSIS — B349 Viral infection, unspecified: Secondary | ICD-10-CM | POA: Diagnosis not present

## 2020-05-01 DIAGNOSIS — R059 Cough, unspecified: Secondary | ICD-10-CM

## 2020-05-01 DIAGNOSIS — R0602 Shortness of breath: Secondary | ICD-10-CM

## 2020-05-01 DIAGNOSIS — Z20822 Contact with and (suspected) exposure to covid-19: Secondary | ICD-10-CM

## 2020-05-01 DIAGNOSIS — J029 Acute pharyngitis, unspecified: Secondary | ICD-10-CM | POA: Diagnosis not present

## 2020-05-01 LAB — CBC WITH DIFFERENTIAL/PLATELET
Abs Immature Granulocytes: 0.06 10*3/uL (ref 0.00–0.07)
Basophils Absolute: 0.1 10*3/uL (ref 0.0–0.1)
Basophils Relative: 1 %
Eosinophils Absolute: 0.2 10*3/uL (ref 0.0–0.5)
Eosinophils Relative: 2 %
HCT: 36.8 % (ref 36.0–46.0)
Hemoglobin: 11.9 g/dL — ABNORMAL LOW (ref 12.0–15.0)
Immature Granulocytes: 1 %
Lymphocytes Relative: 22 %
Lymphs Abs: 1.8 10*3/uL (ref 0.7–4.0)
MCH: 24.7 pg — ABNORMAL LOW (ref 26.0–34.0)
MCHC: 32.3 g/dL (ref 30.0–36.0)
MCV: 76.3 fL — ABNORMAL LOW (ref 80.0–100.0)
Monocytes Absolute: 0.9 10*3/uL (ref 0.1–1.0)
Monocytes Relative: 11 %
Neutro Abs: 5.1 10*3/uL (ref 1.7–7.7)
Neutrophils Relative %: 63 %
Platelets: 272 10*3/uL (ref 150–400)
RBC: 4.82 MIL/uL (ref 3.87–5.11)
RDW: 14.6 % (ref 11.5–15.5)
WBC: 8.1 10*3/uL (ref 4.0–10.5)
nRBC: 0 % (ref 0.0–0.2)

## 2020-05-01 LAB — GLUCOSE, CAPILLARY: Glucose-Capillary: 449 mg/dL — ABNORMAL HIGH (ref 70–99)

## 2020-05-01 LAB — URINALYSIS, COMPLETE (UACMP) WITH MICROSCOPIC
Bilirubin Urine: NEGATIVE
Glucose, UA: 500 mg/dL — AB
Ketones, ur: NEGATIVE mg/dL
Leukocytes,Ua: NEGATIVE
Nitrite: NEGATIVE
Protein, ur: >300 mg/dL — AB
Specific Gravity, Urine: 1.02 (ref 1.005–1.030)
pH: 5.5 (ref 5.0–8.0)

## 2020-05-01 LAB — COMPREHENSIVE METABOLIC PANEL
ALT: 16 U/L (ref 0–44)
AST: 17 U/L (ref 15–41)
Albumin: 3.4 g/dL — ABNORMAL LOW (ref 3.5–5.0)
Alkaline Phosphatase: 123 U/L (ref 38–126)
Anion gap: 11 (ref 5–15)
BUN: 36 mg/dL — ABNORMAL HIGH (ref 6–20)
CO2: 27 mmol/L (ref 22–32)
Calcium: 9.3 mg/dL (ref 8.9–10.3)
Chloride: 93 mmol/L — ABNORMAL LOW (ref 98–111)
Creatinine, Ser: 1.9 mg/dL — ABNORMAL HIGH (ref 0.44–1.00)
GFR, Estimated: 32 mL/min — ABNORMAL LOW (ref >60)
Glucose, Bld: 542 mg/dL (ref 70–99)
Potassium: 5.3 mmol/L — ABNORMAL HIGH (ref 3.5–5.1)
Sodium: 131 mmol/L — ABNORMAL LOW (ref 135–145)
Total Bilirubin: 0.4 mg/dL (ref 0.3–1.2)
Total Protein: 8.6 g/dL — ABNORMAL HIGH (ref 6.5–8.1)

## 2020-05-01 LAB — HEMOGLOBIN A1C
Hgb A1c MFr Bld: 12.8 % — ABNORMAL HIGH (ref 4.8–5.6)
Mean Plasma Glucose: 320.66 mg/dL

## 2020-05-01 MED ORDER — BENZONATATE 100 MG PO CAPS: 200.0000 mg | ORAL_CAPSULE | Freq: Three times a day (TID) | ORAL | 0 refills | Status: AC | PRN

## 2020-05-01 MED ORDER — LIDOCAINE VISCOUS HCL 2 % MT SOLN
5.0000 mL | OROMUCOSAL | 0 refills | Status: AC | PRN
Start: 2020-05-01 — End: 2020-05-06

## 2020-05-01 MED ORDER — AMLODIPINE BESYLATE 5 MG PO TABS
5.0000 mg | ORAL_TABLET | Freq: Every day | ORAL | 0 refills | Status: AC
Start: 2020-05-01 — End: 2021-09-21

## 2020-05-01 MED ORDER — IPRATROPIUM BROMIDE 0.06 % NA SOLN: 2.0000 | Freq: Four times a day (QID) | NASAL | 12 refills | Status: DC

## 2020-05-01 NOTE — Discharge Instructions (Signed)
Your chest x-ray is normal.  Your blood sugar is elevated in the 500s.  You really need to make an appoint with your PCP as soon as possible and start tracking your blood sugars.  Watch your carbohydrate and sugar intake.  Increase your Lantus to 60 units daily and increase your lispro 3 units.  If you feel weak, have abdominal pain/nausea/vomiting, headaches, increased chest pain or breathing problem, or are unable to control your blood sugars you may need to go to the ED.  Your blood pressure is also significantly elevated at 174/99.  I have refilled your amlodipine.  Please keep a log of your blood pressures and follow-up with PCP about this as well since your blood pressure seems to be uncontrolled.  I have sent Tessalon Perles, nasal spray, and viscous lidocaine.  Care is supportive with increasing rest and fluids.  It is likely that you have a viral illness.  Results will be available through MyChart tomorrow.  You have received COVID testing today either for positive exposure, concerning symptoms that could be related to COVID infection, screening purposes, or re-testing after confirmed positive.  Your test obtained today checks for active viral infection in the last 1-2 weeks. If your test is negative now, you can still test positive later. So, if you do develop symptoms you should either get re-tested and/or isolate x 5 days and then strict mask use x 5 days (unvaccinated) or mask use x 10 days (vaccinated). Please follow CDC guidelines.  While Rapid antigen tests come back in 15-20 minutes, send out PCR/molecular test results typically come back within 1-3 days. In the mean time, if you are symptomatic, assume this could be a positive test and treat/monitor yourself as if you do have COVID.   We will call with test results if positive. Please download the MyChart app and set up a profile to access test results.   If symptomatic, go home and rest. Push fluids. Take Tylenol as needed for  discomfort. Gargle warm salt water. Throat lozenges. Take Mucinex DM or Robitussin for cough. Humidifier in bedroom to ease coughing. Warm showers. Also review the COVID handout for more information.  COVID-19 INFECTION: The incubation period of COVID-19 is approximately 14 days after exposure, with most symptoms developing in roughly 4-5 days. Symptoms may range in severity from mild to critically severe. Roughly 80% of those infected will have mild symptoms. People of any age may become infected with COVID-19 and have the ability to transmit the virus. The most common symptoms include: fever, fatigue, cough, body aches, headaches, sore throat, nasal congestion, shortness of breath, nausea, vomiting, diarrhea, changes in smell and/or taste.    COURSE OF ILLNESS Some patients may begin with mild disease which can progress quickly into critical symptoms. If your symptoms are worsening please call ahead to the Emergency Department and proceed there for further treatment. Recovery time appears to be roughly 1-2 weeks for mild symptoms and 3-6 weeks for severe disease.   GO IMMEDIATELY TO ER FOR FEVER YOU ARE UNABLE TO GET DOWN WITH TYLENOL, BREATHING PROBLEMS, CHEST PAIN, FATIGUE, LETHARGY, INABILITY TO EAT OR DRINK, ETC  QUARANTINE AND ISOLATION: To help decrease the spread of COVID-19 please remain isolated if you have COVID infection or are highly suspected to have COVID infection. This means -stay home and isolate to one room in the home if you live with others. Do not share a bed or bathroom with others while ill, sanitize and wipe down all countertops and  keep common areas clean and disinfected. Stay home for 5 days. If you have no symptoms or your symptoms are resolving after 5 days, you can leave your house. Continue to wear a mask around others for 5 additional days. If you have been in close contact (within 6 feet) of someone diagnosed with COVID 19, you are advised to quarantine in your home for 14  days as symptoms can develop anywhere from 2-14 days after exposure to the virus. If you develop symptoms, you  must isolate.  Most current guidelines for COVID after exposure -unvaccinated: isolate 5 days and strict mask use x 5 days. Test on day 5 is possible -vaccinated: wear mask x 10 days if symptoms do not develop -You do not necessarily need to be tested for COVID if you have + exposure and  develop symptoms. Just isolate at home x10 days from symptom onset During this global pandemic, CDC advises to practice social distancing, try to stay at least 36ft away from others at all times. Wear a face covering. Wash and sanitize your hands regularly and avoid going anywhere that is not necessary.  KEEP IN MIND THAT THE COVID TEST IS NOT 100% ACCURATE AND YOU SHOULD STILL DO EVERYTHING TO PREVENT POTENTIAL SPREAD OF VIRUS TO OTHERS (WEAR MASK, WEAR GLOVES, WASH HANDS AND SANITIZE REGULARLY). IF INITIAL TEST IS NEGATIVE, THIS MAY NOT MEAN YOU ARE DEFINITELY NEGATIVE. MOST ACCURATE TESTING IS DONE 5-7 DAYS AFTER EXPOSURE.   It is not advised by CDC to get re-tested after receiving a positive COVID test since you can still test positive for weeks to months after you have already cleared the virus.   *If you have not been vaccinated for COVID, I strongly suggest you consider getting vaccinated as long as there are no contraindications.

## 2020-05-01 NOTE — ED Triage Notes (Signed)
Pt reports having productive cough, sob, sore throat and bila ear pain. symptoms began Saturday.  covid exposure on Friday.Marland Kitchen

## 2020-05-01 NOTE — ED Provider Notes (Signed)
[facility]    CSN: [id number] Arrival date & time: 05/01/20  0825      History   Chief Complaint Chief Complaint  Patient presents with  . Cough    HPI Shelly Benjamin is a 50 y.o. female presenting for 3-4-day history of fatigue, cough, sore throat, nasal congestion, bilateral ear pain, and shortness of breath.  Patient states that she gets more short of breath whenever she has a coughing fit.  She says that occasionally she will feel little short of breath when she is walking as well.  Patient states that she was exposed to COVID-19 today before symptom onset.  Patient has been fully vaccinated for COVID-19.  She does have personal history of COVID-19 in April 2021.  Patient has been taking Alka-Seltzer without much improvement in symptoms.  She denies any fever, weakness, chest pain, abdominal pain, N/V/D, or change in smell or taste. PMH significant for diabetes, hypertension, hyperlipidemia, and stage III CKD.  Additionally, patient states that she has been very thirsty lately.  She has not checked her blood glucose recently.  Patient does not have any other complaints or concerns at this time.  HPI  Past Medical History:  Diagnosis Date  . Diabetes mellitus without complication (HCC)   . GERD (gastroesophageal reflux disease)   . Hyperlipidemia   . Hypertension     Patient Active Problem List   Diagnosis Date Noted  . Encephalopathy due to COVID-19 virus   . Pain of upper abdomen   . Iron deficiency anemia   . Generalized abdominal pain   . Acute kidney injury superimposed on CKD (HCC)   . CKD (chronic kidney disease) stage 3, GFR 30-59 ml/min (HCC) 08/25/2019  . Acute metabolic encephalopathy 08/25/2019  . Acute gastroenteritis 08/25/2019  . Type 2 diabetes mellitus with hyperlipidemia (HCC) 08/25/2019  . Essential hypertension 08/25/2019  . Left-sided weakness 08/25/2019  . Personal history of covid-19  08/09/19 08/25/2019  . Altered mental status  08/25/2019  . Morbid obesity with BMI of 50.0-59.9, adult (HCC) 08/25/2019    Past Surgical History:  Procedure Laterality Date  . CHOLECYSTECTOMY    . TUBAL LIGATION      OB History   No obstetric history on file.      Home Medications    Prior to Admission medications   Medication Sig Start Date End Date Taking? Authorizing Provider  amLODipine (NORVASC) 5 MG tablet Take 1 tablet (5 mg total) by mouth daily. 05/01/20 07/30/20 Yes Shirlee Latch, PA-C  benzonatate (TESSALON) 100 MG capsule Take 2 capsules (200 mg total) by mouth 3 (three) times daily as needed for up to 7 days for cough. 05/01/20 05/08/20 Yes Eusebio Friendly B, PA-C  ipratropium (ATROVENT) 0.06 % nasal spray Place 2 sprays into both nostrils 4 (four) times daily. 05/01/20  Yes Eusebio Friendly B, PA-C  lidocaine (XYLOCAINE) 2 % solution Use as directed 5 mLs in the mouth or throat as needed for up to 5 days for mouth pain (swish and spit). 05/01/20 05/06/20 Yes Shirlee Latch, PA-C  acetaminophen (TYLENOL) 325 MG tablet Take 2 tablets (650 mg total) by mouth every 6 (six) hours as needed for mild pain (or Fever >/= 101). 08/28/19   Alford Highland, MD  amitriptyline (ELAVIL) 10 MG tablet Take 10 mg by mouth at bedtime.    [provider]  atorvastatin (LIPITOR) 40 MG tablet Take 1 tablet by mouth at bedtime. 08/21/17   [provider]  ferrous  sulfate 325 (65 FE) MG EC tablet Take 325 mg by mouth in the morning and at bedtime.    [provider]  insulin glargine (LANTUS) 100 UNIT/ML injection Inject 0.2 mLs (20 Units total) into the skin at bedtime. 08/28/19   Alford HighlandWieting, Richard, MD  Multiple Vitamins-Minerals (ALIVE WOMENS 50+) TABS Take 1 tablet by mouth daily.    [provider]  Pancrelipase, Lip-Prot-Amyl, 24000-76000 units CPEP Take 1-2 capsules by mouth with breakfast, with lunch, and with evening meal. Take 2 capsules with full meals. Take 1 capsule with half meals or snacks. Do not take if not  eating.    [provider]  Pancrelipase, Lip-Prot-Amyl, 24000-76000 units CPEP Take 1 capsule by mouth with snacks.    [provider]  pantoprazole (PROTONIX) 40 MG tablet Take 40 mg by mouth daily.    [provider]    Family History Family History  Problem Relation Age of Onset  . Diabetes Mother   . Heart failure Mother   . Kidney failure Mother   . COPD Mother   . Hypertension Mother   . Other Father        "[place] syndrome"    Social History Social History   Tobacco Use  . Smoking status: Former Smoker    Packs/day: 0.50    Types: Cigarettes    Quit date: 05/07/2018    Years since quitting: 1.9  . Smokeless tobacco: Never Used  Vaping Use  . Vaping Use: Never used  Substance Use Topics  . Alcohol use: No  . Drug use: Never     Allergies   Morphine and related and Naproxen   Review of Systems Review of Systems  Constitutional: Positive for chills and fatigue. Negative for diaphoresis and fever.  HENT: Positive for congestion, ear pain, rhinorrhea and sore throat. Negative for sinus pressure and sinus pain.   Respiratory: Positive for cough. Negative for shortness of breath.   Cardiovascular: Negative for chest pain.  Gastrointestinal: Negative for abdominal pain, nausea and vomiting.  Endocrine: Positive for polydipsia.  Musculoskeletal: Positive for myalgias. Negative for arthralgias.  Skin: Negative for rash.  Neurological: Positive for headaches. Negative for weakness.  Hematological: Negative for adenopathy.     Physical Exam Triage Vital Signs ED Triage Vitals  Enc Vitals Group     BP 05/01/20 0912 (!) 177/115     Pulse Rate 05/01/20 0912 (!) 110     Resp 05/01/20 0912 20     Temp 05/01/20 0912 98.9 F (37.2 C)     Temp Source 05/01/20 0912 Oral     SpO2 05/01/20 0912 100 %     Weight 05/01/20 0913 289 lb (131.1 kg)     Height 05/01/20 0913 5\' 6"  (1.676 m)     Head Circumference --      Peak Flow --       Pain Score 05/01/20 0912 0     Pain Loc --      Pain Edu? --      Excl. in GC? --    No data found.  Updated Vital Signs BP (!) 174/99 (BP Location: Left Arm)   Pulse (!) 123   Temp 98.9 F (37.2 C) (Oral)   Resp 20   Ht 5\' 6"  (1.676 m)   Wt 289 lb (131.1 kg)   SpO2 100%   BMI 46.65 kg/m       Physical Exam Vitals and nursing note reviewed.  Constitutional:  General: She is not in acute distress.    Appearance: Normal appearance. She is obese. She is ill-appearing. She is not toxic-appearing or diaphoretic.  HENT:     Head: Normocephalic and atraumatic.     Nose: Congestion and rhinorrhea present.     Mouth/Throat:     Mouth: Mucous membranes are moist.     Pharynx: Oropharynx is clear. Posterior oropharyngeal erythema (mild) present.  Eyes:     General: No scleral icterus.       Right eye: No discharge.        Left eye: No discharge.     Conjunctiva/sclera: Conjunctivae normal.  Cardiovascular:     Rate and Rhythm: Regular rhythm. Tachycardia present.     Heart sounds: Normal heart sounds.  Pulmonary:     Effort: Pulmonary effort is normal. No respiratory distress.     Breath sounds: Normal breath sounds. No wheezing, rhonchi or rales.  Abdominal:     Palpations: Abdomen is soft.     Tenderness: There is no abdominal tenderness.  Musculoskeletal:     Cervical back: Neck supple.  Skin:    General: Skin is dry.  Neurological:     General: No focal deficit present.     Mental Status: She is alert. Mental status is at baseline.     Motor: No weakness.     Gait: Gait normal.  Psychiatric:        Mood and Affect: Mood normal.        Behavior: Behavior normal.        Thought Content: Thought content normal.      UC Treatments / Results  Labs (all labs ordered are listed, but only abnormal results are displayed) Labs Reviewed  URINALYSIS, COMPLETE (UACMP) WITH MICROSCOPIC - Abnormal; Notable for the following components:      Result Value   Color,  Urine STRAW (*)    APPearance HAZY (*)    Glucose, UA 500 (*)    Hgb urine dipstick SMALL (*)    Protein, ur >300 (*)    Bacteria, UA RARE (*)    All other components within normal limits  GLUCOSE, CAPILLARY - Abnormal; Notable for the following components:   Glucose-Capillary 449 (*)    All other components within normal limits  CBC WITH DIFFERENTIAL/PLATELET - Abnormal; Notable for the following components:   Hemoglobin 11.9 (*)    MCV 76.3 (*)    MCH 24.7 (*)    All other components within normal limits  SARS CORONAVIRUS 2 (TAT 6-24 HRS)  COMPREHENSIVE METABOLIC PANEL  HEMOGLOBIN A1C  CBG MONITORING, ED    EKG   Radiology DG Chest 2 View  Result Date: 05/01/2020 CLINICAL DATA:  50 year old female with history of cough and shortness of breath. COVID exposure. EXAM: CHEST - 2 VIEW COMPARISON:  Chest x-ray 08/25/2019. FINDINGS: Lung volumes are normal. No consolidative airspace disease. No pleural effusions. No pneumothorax. No pulmonary nodule or mass noted. Pulmonary vasculature and the cardiomediastinal silhouette are within normal limits. IMPRESSION: No radiographic evidence of acute cardiopulmonary disease. Electronically Signed   By: Trudie Reed M.D.   On: 05/01/2020 09:53    Procedures Procedures (including critical care time)  Medications Ordered in UC Medications - No data to display  Initial Impression / Assessment and Plan / UC Course  I have reviewed the triage vital signs and the nursing notes.  Pertinent labs & imaging results that were available during my care of the patient were reviewed by me  and considered in my medical decision making (see chart for details).  Clinical Course as of 05/04/20 1626  Fri May 04, 2020  1624 SARS Coronavirus 2(!): POSITIVE [LE]    Clinical Course User Index [LE] Shirlee Latch, PA-C  50 year old female presenting for 3 to 4-day history of cough, sore throat, congestion, shortness of breath, and increased thirst.   Patient is diabetic and hypertensive.  BP elevated 177/115 in clinic and pulse elevated at 110 bpm.  She is afebrile and oxygen is 100%.  On exam, she is ill-appearing, but not toxic.  She does have mild posterior pharyngeal erythema and nasal congestion and rhinorrhea.  Chest is clear to auscultation and heart is regular rhythm.  Blood glucose is 449 in the clinic.  Patient does admit that she does not check it.  She says she takes Lantus daily.  Patient has missed follow-up appointment for diabetes with her PCP.  Last A1c was in April 2021 and was 9.  Urinalysis obtained. 500+ glucose, small blood, >300 protein  Chest x-ray obtained today and independently reviewed by me and is within normal limits.  Covid testing sent out.  Current CDC guidelines, isolation protocol, and ED precautions reviewed with patient if positive. Advised will contact infusion center about MAB therapy if positive.  CBC shows no significant findings.  Metabolic panel shows blood glucose 542 and elevated BUN at 36.  Cr is 1.9 today, was 1.4 on 08/28/19 and 1.97 on 08/27/19  Advised patient also that we are obtaining A1c, especially since she has not had one checked since April 2021 has these new increased polydipsia symptoms.  Patient agreeable.  Advised her that once we get her lab results she will need to follow-up with her PCP as soon as possible as high suspect that her A1c has increased from April 2021 and she may need changes in her medications.  Patient is agreeable.  Did advise her to keep a log of both her blood glucose and blood pressure and follow-up with PCP so she can have better management of these conditions.  ED precautions for elevated blood glucose and possible Covid discussed with patient.  Final Clinical Impressions(s) / UC Diagnoses   Final diagnoses:  Viral illness  Cough  Sore throat  Uncontrolled type 2 diabetes mellitus with hyperglycemia (HCC)     Discharge Instructions     Your chest x-ray  is normal.  Your blood sugar is elevated in the 500s.  You really need to make an appoint with your PCP as soon as possible and start tracking your blood sugars.  Watch your carbohydrate and sugar intake.  Increase your Lantus to 60 units daily and increase your lispro 3 units.  If you feel weak, have abdominal pain/nausea/vomiting, headaches, increased chest pain or breathing problem, or are unable to control your blood sugars you may need to go to the ED.  Your blood pressure is also significantly elevated at 174/99.  I have refilled your amlodipine.  Please keep a log of your blood pressures and follow-up with PCP about this as well since your blood pressure seems to be uncontrolled.  I have sent Tessalon Perles, nasal spray, and viscous lidocaine.  Care is supportive with increasing rest and fluids.  It is likely that you have a viral illness.  Results will be available through MyChart tomorrow.  You have received COVID testing today either for positive exposure, concerning symptoms that could be related to COVID infection, screening purposes, or re-testing after confirmed positive.  Your test obtained today checks for active viral infection in the last 1-2 weeks. If your test is negative now, you can still test positive later. So, if you do develop symptoms you should either get re-tested and/or isolate x 5 days and then strict mask use x 5 days (unvaccinated) or mask use x 10 days (vaccinated). Please follow CDC guidelines.  While Rapid antigen tests come back in 15-20 minutes, send out PCR/molecular test results typically come back within 1-3 days. In the mean time, if you are symptomatic, assume this could be a positive test and treat/monitor yourself as if you do have COVID.   We will call with test results if positive. Please download the MyChart app and set up a profile to access test results.   If symptomatic, go home and rest. Push fluids. Take Tylenol as needed for discomfort. Gargle warm  salt water. Throat lozenges. Take Mucinex DM or Robitussin for cough. Humidifier in bedroom to ease coughing. Warm showers. Also review the COVID handout for more information.  COVID-19 INFECTION: The incubation period of COVID-19 is approximately 14 days after exposure, with most symptoms developing in roughly 4-5 days. Symptoms may range in severity from mild to critically severe. Roughly 80% of those infected will have mild symptoms. People of any age may become infected with COVID-19 and have the ability to transmit the virus. The most common symptoms include: fever, fatigue, cough, body aches, headaches, sore throat, nasal congestion, shortness of breath, nausea, vomiting, diarrhea, changes in smell and/or taste.    COURSE OF ILLNESS Some patients may begin with mild disease which can progress quickly into critical symptoms. If your symptoms are worsening please call ahead to the Emergency Department and proceed there for further treatment. Recovery time appears to be roughly 1-2 weeks for mild symptoms and 3-6 weeks for severe disease.   GO IMMEDIATELY TO ER FOR FEVER YOU ARE UNABLE TO GET DOWN WITH TYLENOL, BREATHING PROBLEMS, CHEST PAIN, FATIGUE, LETHARGY, INABILITY TO EAT OR DRINK, ETC  QUARANTINE AND ISOLATION: To help decrease the spread of COVID-19 please remain isolated if you have COVID infection or are highly suspected to have COVID infection. This means -stay home and isolate to one room in the home if you live with others. Do not share a bed or bathroom with others while ill, sanitize and wipe down all countertops and keep common areas clean and disinfected. Stay home for 5 days. If you have no symptoms or your symptoms are resolving after 5 days, you can leave your house. Continue to wear a mask around others for 5 additional days. If you have been in close contact (within 6 feet) of someone diagnosed with COVID 19, you are advised to quarantine in your home for 14 days as symptoms can  develop anywhere from 2-14 days after exposure to the virus. If you develop symptoms, you  must isolate.  Most current guidelines for COVID after exposure -unvaccinated: isolate 5 days and strict mask use x 5 days. Test on day 5 is possible -vaccinated: wear mask x 10 days if symptoms do not develop -You do not necessarily need to be tested for COVID if you have + exposure and  develop symptoms. Just isolate at home x10 days from symptom onset During this global pandemic, CDC advises to practice social distancing, try to stay at least 39ft away from others at all times. Wear a face covering. Wash and sanitize your hands regularly and avoid going anywhere that is not necessary.  KEEP IN MIND THAT THE COVID TEST IS NOT 100% ACCURATE AND YOU SHOULD STILL DO EVERYTHING TO PREVENT POTENTIAL SPREAD OF VIRUS TO OTHERS (WEAR MASK, WEAR GLOVES, [place] HANDS AND SANITIZE REGULARLY). IF INITIAL TEST IS NEGATIVE, THIS MAY NOT MEAN YOU ARE DEFINITELY NEGATIVE. MOST ACCURATE TESTING IS DONE 5-7 DAYS AFTER EXPOSURE.   It is not advised by CDC to get re-tested after receiving a positive COVID test since you can still test positive for weeks to months after you have already cleared the virus.   *If you have not been vaccinated for COVID, I strongly suggest you consider getting vaccinated as long as there are no contraindications.      ED Prescriptions    Medication Sig Dispense Auth. Provider   amLODipine (NORVASC) 5 MG tablet Take 1 tablet (5 mg total) by mouth daily. 90 tablet Laurene Footman B, PA-C   benzonatate (TESSALON) 100 MG capsule Take 2 capsules (200 mg total) by mouth 3 (three) times daily as needed for up to 7 days for cough. 21 capsule Laurene Footman B, PA-C   lidocaine (XYLOCAINE) 2 % solution Use as directed 5 mLs in the mouth or throat as needed for up to 5 days for mouth pain (swish and spit). 100 mL Laurene Footman B, PA-C   ipratropium (ATROVENT) 0.06 % nasal spray Place 2 sprays into both nostrils 4  (four) times daily. 15 mL Danton Clap, PA-C     PDMP not reviewed this encounter.   Danton Clap, PA-C 05/03/20 1737

## 2020-05-02 LAB — SARS CORONAVIRUS 2 (TAT 6-24 HRS): SARS Coronavirus 2: POSITIVE — AB

## 2020-05-04 ENCOUNTER — Telehealth: Payer: Self-pay | Admitting: Physician Assistant

## 2020-05-04 NOTE — Telephone Encounter (Signed)
Call patient to discuss her lab results.  Advised her that her hemoglobin A1c is 12.8 which is up from 9 last April.  Also made sure she was aware of her positive COVID-19 test and told her I contacted infusion center for the MAB therapy.  Patient states that she feels a lot better than when she came in for her visit a couple days ago.  She says she increased her insulin as we discussed.  I asked her if she been checking her blood glucose, but she says that she honestly has not.  Advised her of the importance of checking her blood glucose as well as her blood pressure and keeping a log to follow-up with her PCP.  Advised her that if she is taking insulin and not checking her sugar then it could end up dropping low.  Patient says she will try to check it.  Advised her to follow-up with our department as needed.

## 2020-09-20 ENCOUNTER — Other Ambulatory Visit: Payer: Self-pay

## 2020-09-20 ENCOUNTER — Emergency Department
Admission: EM | Admit: 2020-09-20 | Discharge: 2020-09-20 | Disposition: A | Discharge status: Home / Self Care | Payer: BC Managed Care – PPO | Attending: Emergency Medicine | Admitting: Emergency Medicine

## 2020-09-20 ENCOUNTER — Encounter: Payer: Self-pay | Admitting: Emergency Medicine

## 2020-09-20 DIAGNOSIS — N183 Chronic kidney disease, stage 3 unspecified: Secondary | ICD-10-CM | POA: Insufficient documentation

## 2020-09-20 DIAGNOSIS — I129 Hypertensive chronic kidney disease with stage 1 through stage 4 chronic kidney disease, or unspecified chronic kidney disease: Secondary | ICD-10-CM | POA: Diagnosis not present

## 2020-09-20 DIAGNOSIS — E119 Type 2 diabetes mellitus without complications: Secondary | ICD-10-CM | POA: Diagnosis not present

## 2020-09-20 DIAGNOSIS — Z794 Long term (current) use of insulin: Secondary | ICD-10-CM | POA: Diagnosis not present

## 2020-09-20 DIAGNOSIS — Z87891 Personal history of nicotine dependence: Secondary | ICD-10-CM | POA: Diagnosis not present

## 2020-09-20 DIAGNOSIS — Z79899 Other long term (current) drug therapy: Secondary | ICD-10-CM | POA: Insufficient documentation

## 2020-09-20 DIAGNOSIS — K859 Acute pancreatitis without necrosis or infection, unspecified: Secondary | ICD-10-CM | POA: Insufficient documentation

## 2020-09-20 DIAGNOSIS — K219 Gastro-esophageal reflux disease without esophagitis: Secondary | ICD-10-CM | POA: Diagnosis not present

## 2020-09-20 DIAGNOSIS — Z8616 Personal history of COVID-19: Secondary | ICD-10-CM | POA: Diagnosis not present

## 2020-09-20 DIAGNOSIS — R1013 Epigastric pain: Secondary | ICD-10-CM

## 2020-09-20 DIAGNOSIS — R1010 Upper abdominal pain, unspecified: Secondary | ICD-10-CM | POA: Diagnosis present

## 2020-09-20 HISTORY — DX: Acute pancreatitis without necrosis or infection, unspecified: K85.90

## 2020-09-20 LAB — CBC WITH DIFFERENTIAL/PLATELET
Abs Immature Granulocytes: 0.04 10*3/uL (ref 0.00–0.07)
Basophils Absolute: 0 10*3/uL (ref 0.0–0.1)
Basophils Relative: 1 %
Eosinophils Absolute: 0.2 10*3/uL (ref 0.0–0.5)
Eosinophils Relative: 3 %
HCT: 33.4 % — ABNORMAL LOW (ref 36.0–46.0)
Hemoglobin: 10.9 g/dL — ABNORMAL LOW (ref 12.0–15.0)
Immature Granulocytes: 1 %
Lymphocytes Relative: 29 %
Lymphs Abs: 2.3 10*3/uL (ref 0.7–4.0)
MCH: 25.7 pg — ABNORMAL LOW (ref 26.0–34.0)
MCHC: 32.6 g/dL (ref 30.0–36.0)
MCV: 78.8 fL — ABNORMAL LOW (ref 80.0–100.0)
Monocytes Absolute: 0.7 10*3/uL (ref 0.1–1.0)
Monocytes Relative: 9 %
Neutro Abs: 4.6 10*3/uL (ref 1.7–7.7)
Neutrophils Relative %: 57 %
Platelets: 266 10*3/uL (ref 150–400)
RBC: 4.24 MIL/uL (ref 3.87–5.11)
RDW: 15 % (ref 11.5–15.5)
WBC: 7.9 10*3/uL (ref 4.0–10.5)
nRBC: 0 % (ref 0.0–0.2)

## 2020-09-20 LAB — URINALYSIS, COMPLETE (UACMP) WITH MICROSCOPIC
Bilirubin Urine: NEGATIVE
Glucose, UA: 150 mg/dL — AB
Ketones, ur: NEGATIVE mg/dL
Leukocytes,Ua: NEGATIVE
Nitrite: NEGATIVE
Protein, ur: >=300 mg/dL — AB
Specific Gravity, Urine: 1.015 (ref 1.005–1.030)
pH: 5 (ref 5.0–8.0)

## 2020-09-20 LAB — LIPASE, BLOOD: Lipase: 67 U/L — ABNORMAL HIGH (ref 11–51)

## 2020-09-20 LAB — COMPREHENSIVE METABOLIC PANEL
ALT: 16 U/L (ref 0–44)
AST: 14 U/L — ABNORMAL LOW (ref 15–41)
Albumin: 3.2 g/dL — ABNORMAL LOW (ref 3.5–5.0)
Alkaline Phosphatase: 104 U/L (ref 38–126)
Anion gap: 10 (ref 5–15)
BUN: 27 mg/dL — ABNORMAL HIGH (ref 6–20)
CO2: 23 mmol/L (ref 22–32)
Calcium: 8.9 mg/dL (ref 8.9–10.3)
Chloride: 100 mmol/L (ref 98–111)
Creatinine, Ser: 1.77 mg/dL — ABNORMAL HIGH (ref 0.44–1.00)
GFR, Estimated: 34 mL/min — ABNORMAL LOW (ref >60)
Glucose, Bld: 311 mg/dL — ABNORMAL HIGH (ref 70–99)
Potassium: 4.7 mmol/L (ref 3.5–5.1)
Sodium: 133 mmol/L — ABNORMAL LOW (ref 135–145)
Total Bilirubin: 0.4 mg/dL (ref 0.3–1.2)
Total Protein: 8.1 g/dL (ref 6.5–8.1)

## 2020-09-20 LAB — POC URINE PREG, ED: Preg Test, Ur: NEGATIVE

## 2020-09-20 LAB — TROPONIN I (HIGH SENSITIVITY): Troponin I (High Sensitivity): 6 ng/L (ref <18)

## 2020-09-20 MED ORDER — HYDROCODONE-ACETAMINOPHEN 5-325 MG PO TABS: 1.0000 | ORAL_TABLET | ORAL | 0 refills | Status: AC | PRN

## 2020-09-20 MED ORDER — HYDROMORPHONE HCL 1 MG/ML IJ SOLN
1.0000 mg | Freq: Once | INTRAMUSCULAR | Status: AC
  Administered 2020-09-20: 1 mg via INTRAMUSCULAR
  Filled 2020-09-20: qty 1

## 2020-09-20 NOTE — ED Triage Notes (Signed)
Pt to triage via w/c with no distress noted, brought in by EMS; reports rt upper abd pain radiating into back with N/V; hx chronic pancreatitis; st seen at [facility] and was told her labs were abnormal but was not admitted nor d/c with any meds

## 2020-09-20 NOTE — ED Provider Notes (Signed)
[facility] Emergency Department Provider Note  Time seen: 8:46 AM  I have reviewed the triage vital signs and the nursing notes.   HISTORY  Chief Complaint Abdominal Pain   HPI Shelly Benjamin is a 50 y.o. female with a past medical history of diabetes, gastric reflux, hypertension, hyperlipidemia, chronic pancreatitis, CKD, presents to the emergency department for continued upper abdominal discomfort.  According to the patient for the past week or so she has had an upper respiratory infection with mild cough congestion she is also experienced upper abdominal pain for the past 3 days consistent with her past episodes of pancreatitis.  Patient denies alcohol use.  Patient states she has had pancreatitis multiple times in the past which feels identical to today's presentation.  Patient denies any fever.  Patient went to [facility] yesterday and was ultimately discharged.   Past Medical History:  Diagnosis Date  . Diabetes mellitus without complication (HCC)   . GERD (gastroesophageal reflux disease)   . Hyperlipidemia   . Hypertension   . Pancreatitis     Patient Active Problem List   Diagnosis Date Noted  . Encephalopathy due to COVID-19 virus   . Pain of upper abdomen   . Iron deficiency anemia   . Generalized abdominal pain   . Acute kidney injury superimposed on CKD (HCC)   . CKD (chronic kidney disease) stage 3, GFR 30-59 ml/min (HCC) 08/25/2019  . Acute metabolic encephalopathy 08/25/2019  . Acute gastroenteritis 08/25/2019  . Type 2 diabetes mellitus with hyperlipidemia (HCC) 08/25/2019  . Essential hypertension 08/25/2019  . Left-sided weakness 08/25/2019  . Personal history of covid-19  08/09/19 08/25/2019  . Altered mental status 08/25/2019  . Morbid obesity with BMI of 50.0-59.9, adult (HCC) 08/25/2019    Past Surgical History:  Procedure Laterality Date  . CHOLECYSTECTOMY    . TUBAL LIGATION      Prior to Admission medications   Medication  Sig Start Date End Date Taking? Authorizing Provider  acetaminophen (TYLENOL) 325 MG tablet Take 2 tablets (650 mg total) by mouth every 6 (six) hours as needed for mild pain (or Fever >/= 101). 08/28/19   Alford Highland, MD  amitriptyline (ELAVIL) 10 MG tablet Take 10 mg by mouth at bedtime.    [provider]  amLODipine (NORVASC) 5 MG tablet Take 1 tablet (5 mg total) by mouth daily. 05/01/20 07/30/20  Eusebio Friendly B, PA-C  atorvastatin (LIPITOR) 40 MG tablet Take 1 tablet by mouth at bedtime. 08/21/17   [provider]  ferrous sulfate 325 (65 FE) MG EC tablet Take 325 mg by mouth in the morning and at bedtime.    [provider]  insulin glargine (LANTUS) 100 UNIT/ML injection Inject 0.2 mLs (20 Units total) into the skin at bedtime. 08/28/19   Alford Highland, MD  ipratropium (ATROVENT) 0.06 % nasal spray Place 2 sprays into both nostrils 4 (four) times daily. 05/01/20   Shirlee Latch, PA-C  Multiple Vitamins-Minerals (ALIVE WOMENS 50+) TABS Take 1 tablet by mouth daily.    [provider]  Pancrelipase, Lip-Prot-Amyl, 24000-76000 units CPEP Take 1-2 capsules by mouth with breakfast, with lunch, and with evening meal. Take 2 capsules with full meals. Take 1 capsule with half meals or snacks. Do not take if not eating.    [provider]  Pancrelipase, Lip-Prot-Amyl, 24000-76000 units CPEP Take 1 capsule by mouth with snacks.    [provider]  pantoprazole (PROTONIX) 40 MG tablet Take 40 mg  by mouth daily.    [provider]    Allergies  Allergen Reactions  . Morphine And Related Itching  . Naproxen Swelling    Family History  Problem Relation Age of Onset  . Diabetes Mother   . Heart failure Mother   . Kidney failure Mother   . COPD Mother   . Hypertension Mother   . Other Father        "haw river syndrome"    Social History Social History   Tobacco Use  . Smoking status: Former Smoker    Packs/day: 0.50    Types:  Cigarettes    Quit date: 05/07/2018    Years since quitting: 2.3  . Smokeless tobacco: Never Used  Vaping Use  . Vaping Use: Never used  Substance Use Topics  . Alcohol use: No  . Drug use: Never    Review of Systems Constitutional: Negative for fever. ENT: Mild congestion x1 week Cardiovascular: Negative for chest pain. Respiratory: Negative for shortness of breath.  Occasional cough. Gastrointestinal: Moderate upper abdominal pain consistent with past pancreatitis.  Positive for nausea but negative for vomiting or diarrhea Genitourinary: Negative for urinary compaints Musculoskeletal: Negative for musculoskeletal complaints Neurological: Negative for headache All other ROS negative  ____________________________________________   PHYSICAL EXAM:  VITAL SIGNS: ED Triage Vitals  Enc Vitals Group     BP 09/20/20 0632 123/77     Pulse Rate 09/20/20 0632 82     Resp 09/20/20 0632 18     Temp 09/20/20 0632 98.1 F (36.7 C)     Temp Source 09/20/20 0632 Oral     SpO2 09/20/20 0622 100 %     Weight 09/20/20 0627 (!) 335 lb (152 kg)     Height 09/20/20 0627 5\' 6"  (1.676 m)     Head Circumference --      Peak Flow --      Pain Score 09/20/20 0627 8     Pain Loc --      Pain Edu? --      Excl. in GC? --    Constitutional: Alert and oriented. Well appearing and in no distress. Eyes: Normal exam ENT      Head: Normocephalic and atraumatic.      Mouth/Throat: Mucous membranes are moist. Cardiovascular: Normal rate, regular rhythm. Respiratory: Normal respiratory effort without tachypnea nor retractions. Breath sounds are clear  Gastrointestinal: Soft, mild to moderate epigastric tenderness remainder of the abdomen is benign.  Obese. Musculoskeletal: Nontender with normal range of motion in all extremities.  Neurologic:  Normal speech and language. No gross focal neurologic deficits Skin:  Skin is warm, dry and intact.  Psychiatric: Mood and affect are normal.    ____________________________________________    EKG  EKG viewed and interpreted by myself shows normal sinus rhythm 83 bpm with a narrow QRS, normal axis, normal intervals, no concerning ST changes.  ____________________________________________   INITIAL IMPRESSION / ASSESSMENT AND PLAN / ED COURSE  Pertinent labs & imaging results that were available during my care of the patient were reviewed by me and considered in my medical decision making (see chart for details).   Patient presents to the emergency department for upper respiratory symptoms x1 week as well as 3 days of upper abdominal pain consistent with past episodes of pancreatitis.  Patient does have a mildly elevated lipase remainder the lab work is largely nonrevealing otherwise.  I reviewed the patient's work-up from [facility] yesterday including COVID/flu/RSV swab which was negative, CT scan  of abdomen/pelvis which was negative as well.  Patient had an elevated lipase yesterday.  Given the patient's history of chronic pancreatitis with abdominal pain consistent with pancreatitis in the past we will treat the patient's pain and discomfort and discharged with a short course of medication.  Discussed with the patient dietary precautions.  Given the patient's reassuring physical exam, reassuring labs and recent CT scan yesterday I believe the patient would be safe for discharge home with outpatient follow-up.  Discussed this plan of care with the patient who is agreeable.  Shelly Benjamin was evaluated in Emergency Department on 09/20/2020 for the symptoms described in the history of present illness. She was evaluated in the context of the global COVID-19 pandemic, which necessitated consideration that the patient might be at risk for infection with the SARS-CoV-2 virus that causes COVID-19. Institutional protocols and algorithms that pertain to the evaluation of patients at risk for COVID-19 are in a state of rapid change based on information  released by regulatory bodies including the CDC and federal and state organizations. These policies and algorithms were followed during the patient's care in the ED.  ____________________________________________   FINAL CLINICAL IMPRESSION(S) / ED DIAGNOSES  Abdominal pain Pancreatitis   Minna Antis, MD 09/20/20 [phone]

## 2020-09-20 NOTE — ED Notes (Signed)
Pt has been provided with discharge instructions. Pt denies any questions or concerns at this time. Pt verbalizes understanding for follow up care and d/c.  VSS.  Pt left department with all belongings.  

## 2020-12-27 ENCOUNTER — Emergency Department: Payer: BC Managed Care – PPO

## 2020-12-27 ENCOUNTER — Encounter: Payer: Self-pay | Admitting: Emergency Medicine

## 2020-12-27 ENCOUNTER — Emergency Department
Admission: EM | Admit: 2020-12-27 | Discharge: 2020-12-27 | Disposition: A | Discharge status: Home / Self Care | Payer: BC Managed Care – PPO | Attending: Emergency Medicine | Admitting: Emergency Medicine

## 2020-12-27 DIAGNOSIS — N183 Chronic kidney disease, stage 3 unspecified: Secondary | ICD-10-CM | POA: Insufficient documentation

## 2020-12-27 DIAGNOSIS — I129 Hypertensive chronic kidney disease with stage 1 through stage 4 chronic kidney disease, or unspecified chronic kidney disease: Secondary | ICD-10-CM | POA: Diagnosis not present

## 2020-12-27 DIAGNOSIS — Z87891 Personal history of nicotine dependence: Secondary | ICD-10-CM | POA: Diagnosis not present

## 2020-12-27 DIAGNOSIS — Z7982 Long term (current) use of aspirin: Secondary | ICD-10-CM | POA: Insufficient documentation

## 2020-12-27 DIAGNOSIS — Z79899 Other long term (current) drug therapy: Secondary | ICD-10-CM | POA: Insufficient documentation

## 2020-12-27 DIAGNOSIS — Z794 Long term (current) use of insulin: Secondary | ICD-10-CM | POA: Diagnosis not present

## 2020-12-27 DIAGNOSIS — R072 Precordial pain: Secondary | ICD-10-CM | POA: Diagnosis not present

## 2020-12-27 DIAGNOSIS — E1122 Type 2 diabetes mellitus with diabetic chronic kidney disease: Secondary | ICD-10-CM | POA: Insufficient documentation

## 2020-12-27 DIAGNOSIS — R079 Chest pain, unspecified: Secondary | ICD-10-CM

## 2020-12-27 DIAGNOSIS — Z8616 Personal history of COVID-19: Secondary | ICD-10-CM | POA: Diagnosis not present

## 2020-12-27 DIAGNOSIS — R0602 Shortness of breath: Secondary | ICD-10-CM | POA: Insufficient documentation

## 2020-12-27 DIAGNOSIS — R000 Tachycardia, unspecified: Secondary | ICD-10-CM | POA: Insufficient documentation

## 2020-12-27 LAB — COMPREHENSIVE METABOLIC PANEL
ALT: 13 U/L (ref 0–44)
AST: 15 U/L (ref 15–41)
Albumin: 3.3 g/dL — ABNORMAL LOW (ref 3.5–5.0)
Alkaline Phosphatase: 85 U/L (ref 38–126)
Anion gap: 9 (ref 5–15)
BUN: 34 mg/dL — ABNORMAL HIGH (ref 6–20)
CO2: 20 mmol/L — ABNORMAL LOW (ref 22–32)
Calcium: 9.1 mg/dL (ref 8.9–10.3)
Chloride: 100 mmol/L (ref 98–111)
Creatinine, Ser: 1.72 mg/dL — ABNORMAL HIGH (ref 0.44–1.00)
GFR, Estimated: 36 mL/min — ABNORMAL LOW (ref >60)
Glucose, Bld: 399 mg/dL — ABNORMAL HIGH (ref 70–99)
Potassium: 4.6 mmol/L (ref 3.5–5.1)
Sodium: 129 mmol/L — ABNORMAL LOW (ref 135–145)
Total Bilirubin: 0.3 mg/dL (ref 0.3–1.2)
Total Protein: 7.9 g/dL (ref 6.5–8.1)

## 2020-12-27 LAB — CBC
HCT: 34.5 % — ABNORMAL LOW (ref 36.0–46.0)
Hemoglobin: 11.6 g/dL — ABNORMAL LOW (ref 12.0–15.0)
MCH: 26.9 pg (ref 26.0–34.0)
MCHC: 33.6 g/dL (ref 30.0–36.0)
MCV: 79.9 fL — ABNORMAL LOW (ref 80.0–100.0)
Platelets: 287 10*3/uL (ref 150–400)
RBC: 4.32 MIL/uL (ref 3.87–5.11)
RDW: 14.6 % (ref 11.5–15.5)
WBC: 11.1 10*3/uL — ABNORMAL HIGH (ref 4.0–10.5)
nRBC: 0 % (ref 0.0–0.2)

## 2020-12-27 LAB — URINALYSIS, COMPLETE (UACMP) WITH MICROSCOPIC
Bilirubin Urine: NEGATIVE
Glucose, UA: 150 mg/dL — AB
Hgb urine dipstick: NEGATIVE
Ketones, ur: NEGATIVE mg/dL
Leukocytes,Ua: NEGATIVE
Nitrite: NEGATIVE
Protein, ur: >=300 mg/dL — AB
Specific Gravity, Urine: 1.012 (ref 1.005–1.030)
pH: 5 (ref 5.0–8.0)

## 2020-12-27 LAB — TROPONIN I (HIGH SENSITIVITY)
Troponin I (High Sensitivity): 9 ng/L (ref <18)
Troponin I (High Sensitivity): 8 ng/L (ref <18)

## 2020-12-27 LAB — CBG MONITORING, ED: Glucose-Capillary: 382 mg/dL — ABNORMAL HIGH (ref 70–99)

## 2020-12-27 LAB — BRAIN NATRIURETIC PEPTIDE: B Natriuretic Peptide: 21.7 pg/mL (ref 0.0–100.0)

## 2020-12-27 MED ORDER — TECHNETIUM TO 99M ALBUMIN AGGREGATED
4.0000 | Freq: Once | INTRAVENOUS | Status: AC
  Administered 2020-12-27: 4.62 via INTRAVENOUS

## 2020-12-27 MED ORDER — ACETAMINOPHEN 500 MG PO TABS
1000.0000 mg | ORAL_TABLET | Freq: Once | ORAL | Status: AC
  Administered 2020-12-27: 1000 mg via ORAL
  Filled 2020-12-27: qty 2

## 2020-12-27 MED ORDER — IOHEXOL 350 MG/ML SOLN
75.0000 mL | Freq: Once | INTRAVENOUS | Status: AC | PRN
  Administered 2020-12-27: 75 mL via INTRAVENOUS

## 2020-12-27 NOTE — ED Provider Notes (Signed)
-----------------------------------------   11:07 AM on 12/27/2020 ----------------------------------------- VQ scan negative for PE.  The patient's reassuring work-up we will discharge patient home with continued supportive care PCP follow-up.  Patient agreeable to plan of care.   Minna Antis, MD 12/27/20 [phone]

## 2020-12-27 NOTE — ED Provider Notes (Signed)
[facility] Emergency Department Provider Note   ____________________________________________   Event Date/Time   First MD Initiated Contact with Patient 12/27/20 0319     (approximate)  I have reviewed the triage vital signs and the nursing notes.   HISTORY  Chief Complaint No chief complaint on file.    HPI Shelly Benjamin is a 50 y.o. female presents via EMS for chest pain  LOCATION: Substernal DURATION: Just prior to arrival TIMING: Slightly improved since onset SEVERITY: Began 8/10 and is now 6/10 QUALITY: Pressure CONTEXT: Patient states that she had a dream about having a heart attack and awoke with severe substernal chest pain that did not improve despite waiting approximately 30 minutes and developed pain radiating into her back with associated tachycardia that she found on a home pulse ox. MODIFYING FACTORS: Denies any exacerbating or relieving factors ASSOCIATED SYMPTOMS: Pain radiating through to the back and tachycardia   Per medical record review patient has history of type 2 diabetes, CKD, and morbid obesity          Past Medical History:  Diagnosis Date   Diabetes mellitus without complication (HCC)    GERD (gastroesophageal reflux disease)    Hyperlipidemia    Hypertension    Pancreatitis     Patient Active Problem List   Diagnosis Date Noted   Encephalopathy due to COVID-19 virus    Pain of upper abdomen    Iron deficiency anemia    Generalized abdominal pain    Acute kidney injury superimposed on CKD (HCC)    CKD (chronic kidney disease) stage 3, GFR 30-59 ml/min (HCC) 08/25/2019   Acute metabolic encephalopathy 08/25/2019   Acute gastroenteritis 08/25/2019   Type 2 diabetes mellitus with hyperlipidemia (HCC) 08/25/2019   Essential hypertension 08/25/2019   Left-sided weakness 08/25/2019   Personal history of covid-19  08/09/19 08/25/2019   Altered mental status 08/25/2019   Morbid obesity with BMI of  50.0-59.9, adult (HCC) 08/25/2019    Past Surgical History:  Procedure Laterality Date   CHOLECYSTECTOMY     TUBAL LIGATION      Prior to Admission medications   Medication Sig Start Date End Date Taking? Authorizing Provider  acetaminophen (TYLENOL) 325 MG tablet Take 2 tablets (650 mg total) by mouth every 6 (six) hours as needed for mild pain (or Fever >/= 101). 08/28/19  Yes Wieting, Richard, MD  acetaminophen (TYLENOL) 500 MG tablet Take by mouth.   Yes [provider]  amitriptyline (ELAVIL) 10 MG tablet Take 10 mg by mouth at bedtime.   Yes [provider]  amLODipine (NORVASC) 5 MG tablet Take 1 tablet by mouth daily. 07/26/20 07/26/21 Yes [provider]  aspirin 81 MG EC tablet Take 1 tablet by mouth daily. 09/01/19 12/18/57 Yes [provider]  atorvastatin (LIPITOR) 40 MG tablet Take 1 tablet by mouth at bedtime. 08/21/17  Yes [provider]  atorvastatin (LIPITOR) 80 MG tablet Take 80 mg by mouth daily. 09/11/20  Yes [provider]  ferrous sulfate 325 (65 FE) MG EC tablet Take 325 mg by mouth in the morning and at bedtime.   Yes [provider]  insulin aspart (NOVOLOG) 100 UNIT/ML FlexPen Inject 17 Units into the skin in the morning, at noon, and at bedtime. 07/26/20 07/21/21 Yes [provider]  insulin glargine-yfgn (SEMGLEE) 100 UNIT/ML Pen Inject 60 Units into the skin at bedtime. 09/14/20 02/18/21 Yes [provider]  ipratropium (ATROVENT) 0.06 % nasal spray Place 2  sprays into both nostrils 4 (four) times daily. 05/01/20  Yes Eusebio Friendly B, PA-C  lisinopril (ZESTRIL) 30 MG tablet Take 30 mg by mouth daily. 09/11/20  Yes [provider]  metFORMIN (GLUCOPHAGE-XR) 500 MG 24 hr tablet Take 1,000 mg by mouth in the morning and at bedtime. 08/24/20 08/24/21 Yes [provider]  Multiple Vitamins-Minerals (ALIVE WOMENS 50+) TABS Take 1 tablet by mouth daily.   Yes [provider]   pantoprazole (PROTONIX) 40 MG tablet Take 40 mg by mouth daily.   Yes [provider]  spironolactone (ALDACTONE) 25 MG tablet Take 1 tablet by mouth daily. 08/24/20 08/24/21 Yes [provider]  topiramate (TOPAMAX) 25 MG tablet Take 25 mg by mouth 2 (two) times daily. 12/19/20  Yes [provider]  amLODipine (NORVASC) 5 MG tablet Take 1 tablet (5 mg total) by mouth daily. 05/01/20 07/30/20  Shirlee Latch, PA-C  HYDROcodone-acetaminophen (NORCO/VICODIN) 5-325 MG tablet Take 1 tablet by mouth every 4 (four) hours as needed for moderate pain. Patient not taking: No sig reported 09/20/20 09/20/21  Minna Antis, MD  insulin glargine (LANTUS) 100 UNIT/ML injection Inject 0.2 mLs (20 Units total) into the skin at bedtime. Patient not taking: No sig reported 08/28/19   Alford Highland, MD  Pancrelipase, Lip-Prot-Amyl, 24000-76000 units CPEP Take 1-2 capsules by mouth with breakfast, with lunch, and with evening meal. Take 2 capsules with full meals. Take 1 capsule with half meals or snacks. Do not take if not eating. Patient not taking: No sig reported    [provider]  Pancrelipase, Lip-Prot-Amyl, 24000-76000 units CPEP Take 1 capsule by mouth with snacks. Patient not taking: No sig reported    [provider]    Allergies Morphine and related and Naproxen  Family History  Problem Relation Age of Onset   Diabetes Mother    Heart failure Mother    Kidney failure Mother    COPD Mother    Hypertension Mother    Other Father        "haw river syndrome"    Social History Social History   Tobacco Use   Smoking status: Former    Packs/day: 0.50    Types: Cigarettes    Quit date: 05/07/2018    Years since quitting: 2.6   Smokeless tobacco: Never  Vaping Use   Vaping Use: Never used  Substance Use Topics   Alcohol use: No   Drug use: Never    Review of Systems Constitutional: No fever/chills Eyes: No visual changes. ENT: No sore  throat. Cardiovascular: Endorses chest pain. Respiratory: Denies shortness of breath. Gastrointestinal: No abdominal pain.  No nausea, no vomiting.  No diarrhea. Genitourinary: Negative for dysuria. Musculoskeletal: Negative for acute arthralgias Skin: Negative for rash. Neurological: Negative for headaches, weakness/numbness/paresthesias in any extremity Psychiatric: Negative for suicidal ideation/homicidal ideation   ____________________________________________   PHYSICAL EXAM:  VITAL SIGNS: ED Triage Vitals  Enc Vitals Group     BP 12/27/20 0321 (!) 196/156     Pulse Rate 12/27/20 0321 (!) 113     Resp 12/27/20 0321 (!) 22     Temp 12/27/20 0321 98 F (36.7 C)     Temp Source 12/27/20 0321 Oral     SpO2 12/27/20 0321 99 %     Weight 12/27/20 0323 (!) 332 lb (150.6 kg)     Height 12/27/20 0323 5\' 6"  (1.676 m)     Head Circumference --      Peak Flow --  Pain Score --      Pain Loc --      Pain Edu? --      Excl. in GC? --    Constitutional: Alert and oriented. Well appearing middle-aged to be sure showed up morbidly obese African-American female in no acute distress. Eyes: Conjunctivae are normal. PERRL. Head: Atraumatic. Nose: No congestion/rhinnorhea. Mouth/Throat: Mucous membranes are moist. Neck: No stridor Cardiovascular: Grossly normal heart sounds.  Good peripheral circulation. Respiratory: Normal respiratory effort.  No retractions. Gastrointestinal: Soft and nontender. No distention. Musculoskeletal: No obvious deformities Neurologic:  Normal speech and language. No gross focal neurologic deficits are appreciated. Skin:  Skin is warm and dry. No rash noted. Psychiatric: Mood and affect are normal. Speech and behavior are normal.  ____________________________________________   LABS (all labs ordered are listed, but only abnormal results are displayed)  Labs Reviewed  CBC - Abnormal; Notable for the following components:      Result Value   WBC  11.1 (*)    Hemoglobin 11.6 (*)    HCT 34.5 (*)    MCV 79.9 (*)    All other components within normal limits  URINALYSIS, COMPLETE (UACMP) WITH MICROSCOPIC - Abnormal; Notable for the following components:   Color, Urine YELLOW (*)    APPearance HAZY (*)    Glucose, UA 150 (*)    Protein, ur >=300 (*)    Bacteria, UA RARE (*)    All other components within normal limits  COMPREHENSIVE METABOLIC PANEL - Abnormal; Notable for the following components:   Sodium 129 (*)    CO2 20 (*)    Glucose, Bld 399 (*)    BUN 34 (*)    Creatinine, Ser 1.72 (*)    Albumin 3.3 (*)    GFR, Estimated 36 (*)    All other components within normal limits  CBG MONITORING, ED - Abnormal; Notable for the following components:   Glucose-Capillary 382 (*)    All other components within normal limits  BRAIN NATRIURETIC PEPTIDE  POC URINE PREG, ED  CBG MONITORING, ED  TROPONIN I (HIGH SENSITIVITY)  TROPONIN I (HIGH SENSITIVITY)   ____________________________________________  EKG  ED ECG REPORT I, Merwyn Katos, the attending physician, personally viewed and interpreted this ECG.  Date: 12/27/2020 EKG Time: 0321 Rate: 113 Rhythm: normal sinus rhythm QRS Axis: normal Intervals: normal ST/T Wave abnormalities: normal Narrative Interpretation: no evidence of acute ischemia   PROCEDURES  Procedure(s) performed (including Critical Care):  Procedures   ____________________________________________   INITIAL IMPRESSION / ASSESSMENT AND PLAN / ED COURSE  As part of my medical decision making, I reviewed the following data within the electronic medical record, if available:  Nursing notes reviewed and incorporated, Labs reviewed, EKG interpreted, Old chart reviewed, Radiograph reviewed and Notes from prior ED visits reviewed and incorporated        Workup: ECG, CXR, CBC, BMP, Troponin Findings: ECG: No overt evidence of STEMI. No evidence of Brugadas sign, delta wave, epsilon wave,  significantly prolonged QTc, or malignant arrhythmia HS Troponin: Negative x1 Other Labs unremarkable for emergent problems. CXR: Without PTX, PNA, or widened mediastinum Last Stress Test:  never Last Heart Catheterization:  never HEART Score: 2  Given History, Exam, and Workup I have low suspicion for ACS, Pneumothorax, Pneumonia, Pulmonary Embolus, Tamponade, Aortic Dissection or other emergent problem as a cause for this presentation.   Reassesment: Prior to discharge patients pain was controlled and they were well appearing.  Disposition: Care of this patient will  be signed out to the oncoming physician at the end of my shift.  All pertinent patient information conveyed and all questions answered.  All further care and disposition decisions will be made by the oncoming physician.      ____________________________________________   FINAL CLINICAL IMPRESSION(S) / ED DIAGNOSES  Final diagnoses:  Chest pain, unspecified type     ED Discharge Orders     None        Note:  This document was prepared using Dragon voice recognition software and may include unintentional dictation errors.    Merwyn KatosBradler, Scorpio Fortin K, MD 01/02/21 1539

## 2020-12-27 NOTE — ED Triage Notes (Addendum)
Pt arrived via EMS from home where she woke with sudden left sided chest pain that radiates into the left shoulder, accompanied by SOB. Pt took 5, 81mg  Aspirin before EMS arrival. EMS provided 1 spray of nitro with no change in pain. Pt sts she has had cough, congestion and weakness since COVID diagnosis on 8/16. Pt seen at [facility] last week due to weakness and was given fluids, magnesium, tylenol and anti-diarrhea medication. Pt still having diarrhea. Pt denies fever but reports intermittent chills.   Blood sugar and blood pressure elevated with EMS  CBG 400 BP 190/126

## 2020-12-27 NOTE — ED Notes (Signed)
Patient transported to nuclear medicine at this time.

## 2021-09-21 ENCOUNTER — Encounter: Payer: Self-pay | Admitting: Emergency Medicine

## 2021-09-21 ENCOUNTER — Other Ambulatory Visit: Payer: Self-pay

## 2021-09-21 ENCOUNTER — Ambulatory Visit
Admission: EM | Admit: 2021-09-21 | Discharge: 2021-09-21 | Disposition: A | Discharge status: Home / Self Care | Payer: BC Managed Care – PPO | Attending: Physician Assistant | Admitting: Physician Assistant

## 2021-09-21 DIAGNOSIS — R051 Acute cough: Secondary | ICD-10-CM | POA: Diagnosis present

## 2021-09-21 DIAGNOSIS — Z8719 Personal history of other diseases of the digestive system: Secondary | ICD-10-CM | POA: Diagnosis not present

## 2021-09-21 DIAGNOSIS — E669 Obesity, unspecified: Secondary | ICD-10-CM | POA: Diagnosis not present

## 2021-09-21 DIAGNOSIS — E1122 Type 2 diabetes mellitus with diabetic chronic kidney disease: Secondary | ICD-10-CM | POA: Insufficient documentation

## 2021-09-21 DIAGNOSIS — Z794 Long term (current) use of insulin: Secondary | ICD-10-CM | POA: Insufficient documentation

## 2021-09-21 DIAGNOSIS — J029 Acute pharyngitis, unspecified: Secondary | ICD-10-CM | POA: Diagnosis present

## 2021-09-21 DIAGNOSIS — N183 Chronic kidney disease, stage 3 unspecified: Secondary | ICD-10-CM | POA: Diagnosis not present

## 2021-09-21 DIAGNOSIS — K219 Gastro-esophageal reflux disease without esophagitis: Secondary | ICD-10-CM | POA: Insufficient documentation

## 2021-09-21 DIAGNOSIS — I129 Hypertensive chronic kidney disease with stage 1 through stage 4 chronic kidney disease, or unspecified chronic kidney disease: Secondary | ICD-10-CM | POA: Diagnosis not present

## 2021-09-21 DIAGNOSIS — J040 Acute laryngitis: Secondary | ICD-10-CM | POA: Diagnosis present

## 2021-09-21 DIAGNOSIS — Z20822 Contact with and (suspected) exposure to covid-19: Secondary | ICD-10-CM | POA: Insufficient documentation

## 2021-09-21 DIAGNOSIS — E785 Hyperlipidemia, unspecified: Secondary | ICD-10-CM | POA: Diagnosis not present

## 2021-09-21 LAB — GROUP A STREP BY PCR: Group A Strep by PCR: NOT DETECTED

## 2021-09-21 LAB — SARS CORONAVIRUS 2 BY RT PCR: SARS Coronavirus 2 by RT PCR: NEGATIVE

## 2021-09-21 NOTE — ED Triage Notes (Signed)
Patient c/o cough, sore throat and congestion that started on Tuesday.  Patient was discharged from hospital over a week ago for pancreatitis. Patient denies fevers.  Patient reports chills.

## 2021-09-21 NOTE — Discharge Instructions (Addendum)
-  Negative strep and COVID - Likely have viral laryngitis. - Take the benzonatate you have at home and use the viscous lidocaine.  I am sorry there is nothing else to give you since you have a lot of fears about things that can flareup your pancreatitis. - If you develop a fever, worsening cough or should have increased breathing difficulty, you should return and consider chest x-ray to check you for pneumonia. - You should be feeling better in the next week.

## 2021-09-21 NOTE — ED Provider Notes (Signed)
[facility]    CSN: [id number] Arrival date & time: 09/21/21  1318      History   Chief Complaint Chief Complaint  Patient presents with   Sore Throat   Cough    HPI Shelly Benjamin is a 51 y.o. female presenting for approximately 4-day history of cough, sore throat and congestion.  Patient says she started to experience voice hoarseness that began yesterday.  She has not had any fevers.  Has felt a little short of breath at times, more than normal.  She denies any sick contacts but does report that she recently got to the hospital for pancreatitis a couple of weeks ago, about 5 days before onset of symptoms.  Patient says there are a lot of medication she cannot take including Mucinex or any cough medicine.  She says anything with alcohol can flareup her pancreatitis so she does not take it.  She has not been taking anything for her symptoms.  Other medical history significant for diabetes, on insulin, GERD, hypertension, hyperlipidemia, obesity.  HPI  Past Medical History:  Diagnosis Date   Diabetes mellitus without complication ([place])    GERD (gastroesophageal reflux disease)    Hyperlipidemia    Hypertension    Pancreatitis     Patient Active Problem List   Diagnosis Date Noted   Encephalopathy due to COVID-19 virus    Pain of upper abdomen    Iron deficiency anemia    Generalized abdominal pain    Acute kidney injury superimposed on CKD (HCC)    CKD (chronic kidney disease) stage 3, GFR 30-59 ml/min ([place]) 123456   Acute metabolic encephalopathy 123456   Acute gastroenteritis 08/25/2019   Type 2 diabetes mellitus with hyperlipidemia ([place]) 08/25/2019   Essential hypertension 08/25/2019   Left-sided weakness 08/25/2019   Personal history of covid-19  08/09/19 08/25/2019   Altered mental status 08/25/2019   Morbid obesity with BMI of 50.0-59.9, adult ([place]) 08/25/2019    Past Surgical History:  Procedure Laterality Date   CHOLECYSTECTOMY      TUBAL LIGATION      OB History   No obstetric history on file.      Home Medications    Prior to Admission medications   Medication Sig Start Date End Date Taking? Authorizing Provider  amitriptyline (ELAVIL) 10 MG tablet Take 10 mg by mouth at bedtime.   Yes [provider]  amLODipine (NORVASC) 5 MG tablet Take 1 tablet (5 mg total) by mouth daily. 05/01/20 09/21/21 Yes Danton Clap, PA-C  aspirin 81 MG EC tablet Take 1 tablet by mouth daily. 09/01/19 12/18/57 Yes [provider]  atorvastatin (LIPITOR) 80 MG tablet Take 80 mg by mouth daily. 09/11/20  Yes [provider]  ferrous sulfate 325 (65 FE) MG EC tablet Take 325 mg by mouth in the morning and at bedtime.   Yes [provider]  lisinopril (ZESTRIL) 30 MG tablet Take 30 mg by mouth daily. 09/11/20  Yes [provider]  metFORMIN (GLUCOPHAGE-XR) 500 MG 24 hr tablet Take 1,000 mg by mouth in the morning and at bedtime. 08/24/20 09/21/21 Yes [provider]  pantoprazole (PROTONIX) 40 MG tablet Take 40 mg by mouth daily.   Yes [provider]  acetaminophen (TYLENOL) 325 MG tablet Take 2 tablets (650 mg total) by mouth every 6 (six) hours as needed for mild pain (or Fever >/= 101). 08/28/19   Loletha Grayer, MD  acetaminophen (TYLENOL) 500 MG tablet Take by mouth.  [provider]  amLODipine (NORVASC) 5 MG tablet Take 1 tablet by mouth daily. 07/26/20 07/26/21  [provider]  atorvastatin (LIPITOR) 40 MG tablet Take 1 tablet by mouth at bedtime. 08/21/17   [provider]  insulin glargine (LANTUS) 100 UNIT/ML injection Inject 0.2 mLs (20 Units total) into the skin at bedtime. Patient not taking: Reported on 12/27/2020 08/28/19   Alford Highland, MD  ipratropium (ATROVENT) 0.06 % nasal spray Place 2 sprays into both nostrils 4 (four) times daily. 05/01/20   Shirlee Latch, PA-C  Multiple Vitamins-Minerals (ALIVE WOMENS 50+) TABS Take 1 tablet by mouth  daily.    [provider]  Pancrelipase, Lip-Prot-Amyl, 24000-76000 units CPEP Take 1-2 capsules by mouth with breakfast, with lunch, and with evening meal. Take 2 capsules with full meals. Take 1 capsule with half meals or snacks. Do not take if not eating. Patient not taking: Reported on 12/27/2020    [provider]  Pancrelipase, Lip-Prot-Amyl, 24000-76000 units CPEP Take 1 capsule by mouth with snacks. Patient not taking: Reported on 12/27/2020    [provider]  SEMGLEE, YFGN, 100 UNIT/ML Pen Inject into the skin. 08/23/21   [provider]  spironolactone (ALDACTONE) 25 MG tablet Take 1 tablet by mouth daily. 08/24/20 08/24/21  [provider]  topiramate (TOPAMAX) 25 MG tablet Take 25 mg by mouth 2 (two) times daily. 12/19/20   [provider]    Family History Family History  Problem Relation Age of Onset   Diabetes Mother    Heart failure Mother    Kidney failure Mother    COPD Mother    Hypertension Mother    Other Father        "haw river syndrome"    Social History Social History   Tobacco Use   Smoking status: Former    Packs/day: 0.50    Types: Cigarettes    Quit date: 05/07/2018    Years since quitting: 3.3   Smokeless tobacco: Never  Vaping Use   Vaping Use: Never used  Substance Use Topics   Alcohol use: No   Drug use: Never     Allergies   Morphine and related and Naproxen   Review of Systems Review of Systems  Constitutional:  Positive for chills and fatigue. Negative for diaphoresis and fever.  HENT:  Positive for congestion, rhinorrhea and sore throat. Negative for ear pain, sinus pressure and sinus pain.   Respiratory:  Positive for cough and shortness of breath.   Cardiovascular:  Negative for chest pain.  Gastrointestinal:  Negative for abdominal pain, nausea and vomiting.  Musculoskeletal:  Negative for arthralgias and myalgias.  Skin:  Negative for rash.  Neurological:  Negative for weakness  and headaches.  Hematological:  Negative for adenopathy.    Physical Exam Triage Vital Signs ED Triage Vitals  Enc Vitals Group     BP 09/21/21 1346 (!) 160/86     Pulse Rate 09/21/21 1346 (!) 108     Resp 09/21/21 1346 15     Temp 09/21/21 1346 98.7 F (37.1 C)     Temp Source 09/21/21 1346 Oral     SpO2 09/21/21 1346 99 %     Weight 09/21/21 1339 (!) 310 lb (140.6 kg)     Height 09/21/21 1339 5\' 6"  (1.676 m)     Head Circumference --      Peak Flow --      Pain Score 09/21/21 1339 10     Pain  Loc --      Pain Edu? --      Excl. in [place]? --    No data found.  Updated Vital Signs BP (!) 160/86 (BP Location: Left Arm)   Pulse (!) 108   Temp 98.7 F (37.1 C) (Oral)   Resp 15   Ht 5\' 6"  (1.676 m)   Wt (!) 310 lb (140.6 kg)   LMP  (LMP Unknown) Comment: pt had a tubal ligation  SpO2 99%   BMI 50.04 kg/m      Physical Exam Vitals and nursing note reviewed.  Constitutional:      General: She is not in acute distress.    Appearance: Normal appearance. She is well-developed. She is obese. She is ill-appearing. She is not toxic-appearing.     Comments: +voice hoarseness  HENT:     Head: Normocephalic and atraumatic.     Nose: Congestion present.     Mouth/Throat:     Mouth: Mucous membranes are moist.     Pharynx: Oropharynx is clear. Posterior oropharyngeal erythema present.     Tonsils: 1+ on the right. 1+ on the left.  Eyes:     General: No scleral icterus.       Right eye: No discharge.        Left eye: No discharge.     Conjunctiva/sclera: Conjunctivae normal.  Cardiovascular:     Rate and Rhythm: Normal rate and regular rhythm.     Heart sounds: Normal heart sounds.  Pulmonary:     Effort: Pulmonary effort is normal. No respiratory distress.     Breath sounds: Normal breath sounds.  Musculoskeletal:     Cervical back: Neck supple.  Skin:    General: Skin is dry.  Neurological:     General: No focal deficit present.     Mental Status: She is alert.  Mental status is at baseline.     Motor: No weakness.     Gait: Gait normal.  Psychiatric:        Mood and Affect: Mood normal.        Behavior: Behavior normal.        Thought Content: Thought content normal.     UC Treatments / Results  Labs (all labs ordered are listed, but only abnormal results are displayed) Labs Reviewed  GROUP A STREP BY PCR  SARS CORONAVIRUS 2 BY RT PCR    EKG   Radiology No results found.  Procedures Procedures (including critical care time)  Medications Ordered in UC Medications - No data to display  Initial Impression / Assessment and Plan / UC Course  I have reviewed the triage vital signs and the nursing notes.  Pertinent labs & imaging results that were available during my care of the patient were reviewed by me and considered in my medical decision making (see chart for details).  51 year old female presenting for cough, congestion, sore throat and voice hoarseness.  Onset of symptoms about 4 days ago.  Patient is afebrile.  She is ill-appearing and hoarse.  She is nontoxic.  She does have some nasal congestion and erythema posterior pharynx with 1+ bilateral enlarged tonsils and clear postnasal drainage.  Her chest is clear to auscultation heart regular rhythm but tachycardic.  PCR strep test performed and negative.  Rapid COVID test performed and negative.  Offered chest x-ray to patient especially given that she was in the hospital recently.  She declined stating she does not feel that short of breath and is  more concerned about her throat and voice hoarseness and asked for amoxicillin.  Advised patient her strep test was negative and and that she has pneumonia, she does not need antibiotics at this time.  I have low suspicion for pneumonia.  Advised her symptoms likely viral, viral laryngitis.  Supportive care advised.  Advised taking the benzonatate she has at home as well as the lidocaine she has.  Increase rest and fluids, Tylenol  for discomfort.  Reviewed return and ER precautions.  Final Clinical Impressions(s) / UC Diagnoses   Final diagnoses:  Laryngitis  Sore throat  Acute cough     Discharge Instructions      -Negative strep and COVID - Likely have viral laryngitis. - Take the benzonatate you have at home and use the viscous lidocaine.  I am sorry there is nothing else to give you since you have a lot of fears about things that can flareup your pancreatitis. - If you develop a fever, worsening cough or should have increased breathing difficulty, you should return and consider chest x-ray to check you for pneumonia. - You should be feeling better in the next week.     ED Prescriptions   None    PDMP not reviewed this encounter.   Danton Clap, PA-C 09/21/21 1506

## 2021-10-03 ENCOUNTER — Other Ambulatory Visit: Payer: Self-pay

## 2021-10-03 ENCOUNTER — Emergency Department
Admission: EM | Admit: 2021-10-03 | Discharge: 2021-10-03 | Discharge status: Against Medical Advice | Payer: BC Managed Care – PPO | Attending: Emergency Medicine | Admitting: Emergency Medicine

## 2021-10-03 DIAGNOSIS — R5383 Other fatigue: Secondary | ICD-10-CM | POA: Diagnosis not present

## 2021-10-03 DIAGNOSIS — Z5321 Procedure and treatment not carried out due to patient leaving prior to being seen by health care provider: Secondary | ICD-10-CM | POA: Insufficient documentation

## 2021-10-03 LAB — CBC
HCT: 35.6 % — ABNORMAL LOW (ref 36.0–46.0)
Hemoglobin: 11.2 g/dL — ABNORMAL LOW (ref 12.0–15.0)
MCH: 24.4 pg — ABNORMAL LOW (ref 26.0–34.0)
MCHC: 31.5 g/dL (ref 30.0–36.0)
MCV: 77.6 fL — ABNORMAL LOW (ref 80.0–100.0)
Platelets: 331 10*3/uL (ref 150–400)
RBC: 4.59 MIL/uL (ref 3.87–5.11)
RDW: 15.3 % (ref 11.5–15.5)
WBC: 7.7 10*3/uL (ref 4.0–10.5)
nRBC: 0 % (ref 0.0–0.2)

## 2021-10-03 LAB — URINALYSIS, ROUTINE W REFLEX MICROSCOPIC
Bilirubin Urine: NEGATIVE
Glucose, UA: NEGATIVE mg/dL
Hgb urine dipstick: NEGATIVE
Ketones, ur: NEGATIVE mg/dL
Leukocytes,Ua: NEGATIVE
Nitrite: NEGATIVE
Protein, ur: >=300 mg/dL — AB
Specific Gravity, Urine: 1.014 (ref 1.005–1.030)
pH: 7 (ref 5.0–8.0)

## 2021-10-03 LAB — BASIC METABOLIC PANEL
Anion gap: 8 (ref 5–15)
BUN: 24 mg/dL — ABNORMAL HIGH (ref 6–20)
CO2: 23 mmol/L (ref 22–32)
Calcium: 9.9 mg/dL (ref 8.9–10.3)
Chloride: 103 mmol/L (ref 98–111)
Creatinine, Ser: 1.76 mg/dL — ABNORMAL HIGH (ref 0.44–1.00)
GFR, Estimated: 34 mL/min — ABNORMAL LOW (ref >60)
Glucose, Bld: 250 mg/dL — ABNORMAL HIGH (ref 70–99)
Potassium: 4.6 mmol/L (ref 3.5–5.1)
Sodium: 134 mmol/L — ABNORMAL LOW (ref 135–145)

## 2021-10-03 NOTE — ED Triage Notes (Signed)
Pt comes into the ED via EMS from home with c/o lethargy since discharged on Tuesday after being dx with PNE and pancreatitis. Denies abd pain or SOB  CBG249 201/119 HR92 99%RA

## 2022-06-14 IMAGING — CT CT ANGIO CHEST
2 of 6 series · 18 of 46 positions shown · IV contrast (APPLIED)
Comparison: None.

CLINICAL DATA: Sudden onset left chest pain radiating to the
shoulder

EXAM:
CT ANGIOGRAPHY CHEST WITH CONTRAST
TECHNIQUE: Multidetector CT imaging of the chest was performed using the
standard protocol during bolus administration of intravenous
contrast. Multiplanar CT image reconstructions and MIPs were
obtained to evaluate the vascular anatomy.
CONTRAST:  75mL OMNIPAQUE IOHEXOL 350 MG/ML SOLN

[Series 7: thins · axial · 0.68mm/px · z∈[-263,-16]mm · 15 of 339 slices shown]
[im 15/339  lung]
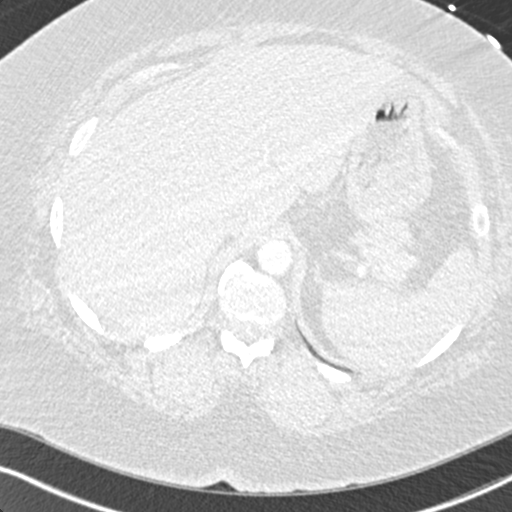
[im 43/339  soft-tissue]
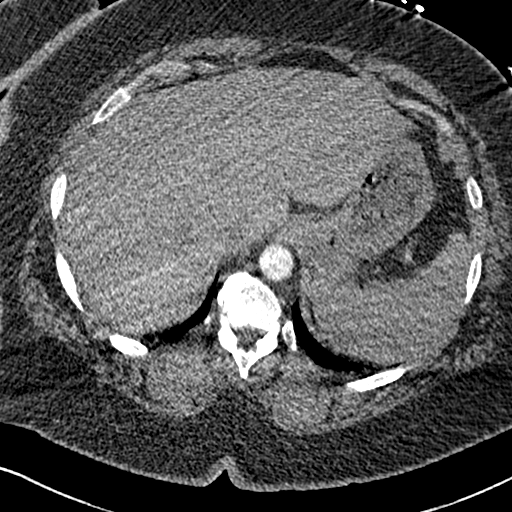
[im 57/339  lung]
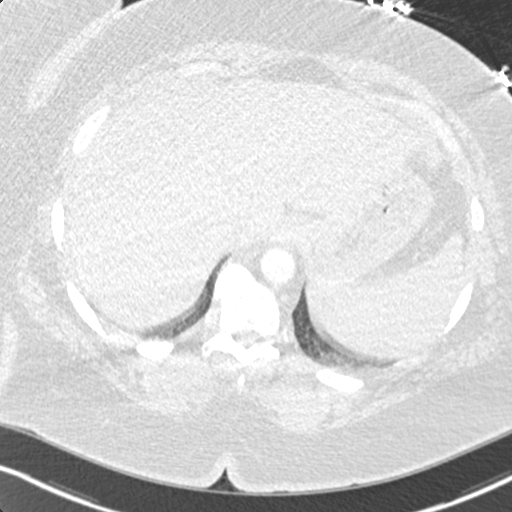
[im 85/339  soft-tissue]
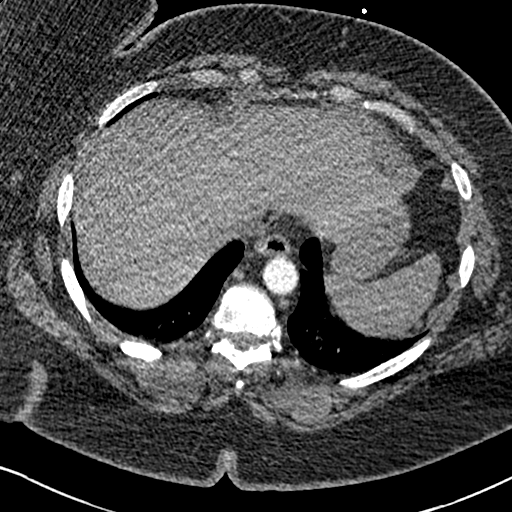
[im 99/339  lung]
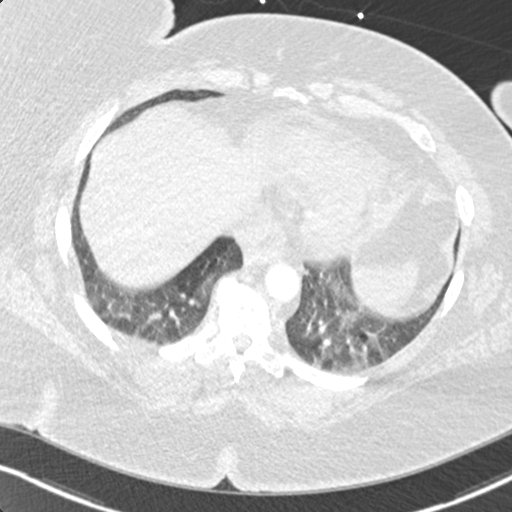
[im 127/339  soft-tissue]
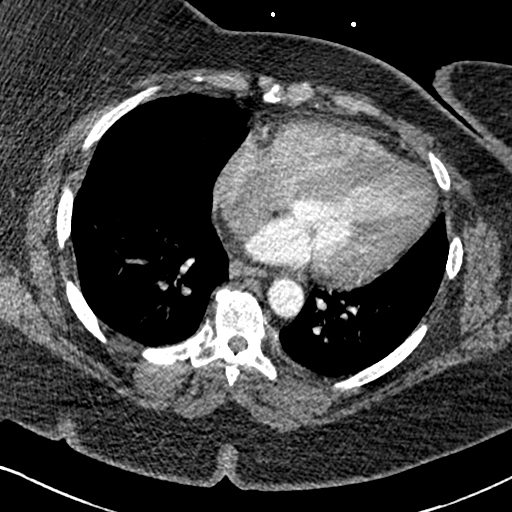
[im 141/339  lung]
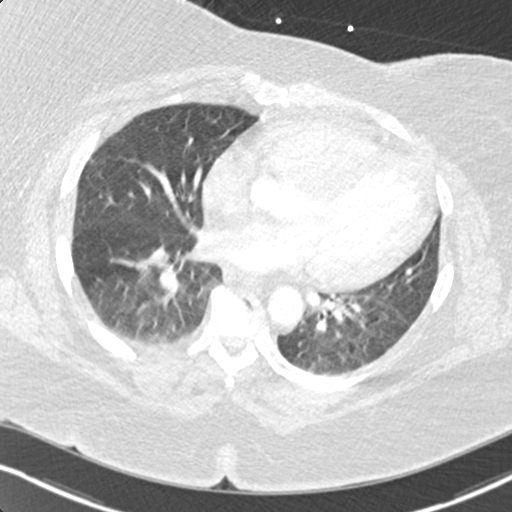
[im 170/339  soft-tissue]
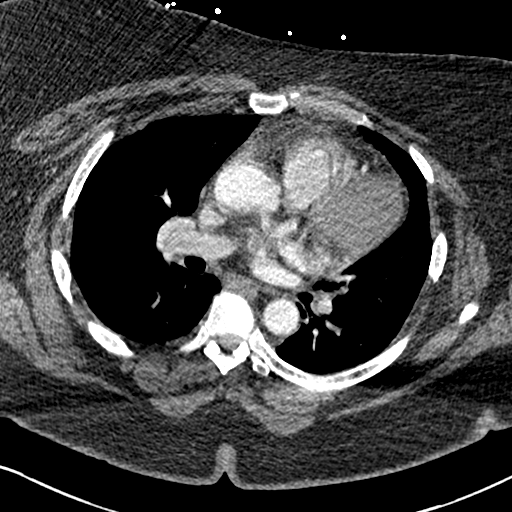
[im 198/339  lung]
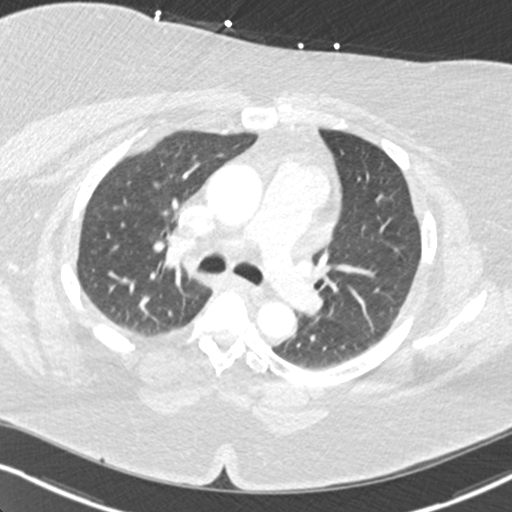
[im 212/339  soft-tissue]
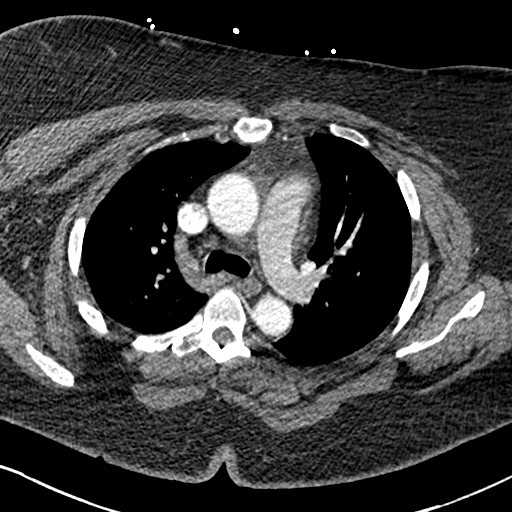
[im 240/339  lung]
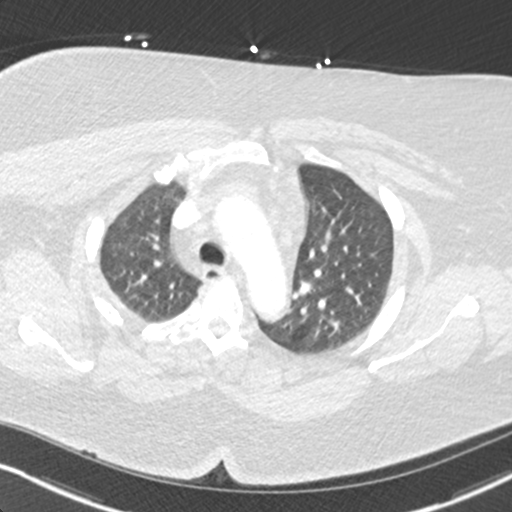
[im 254/339  soft-tissue]
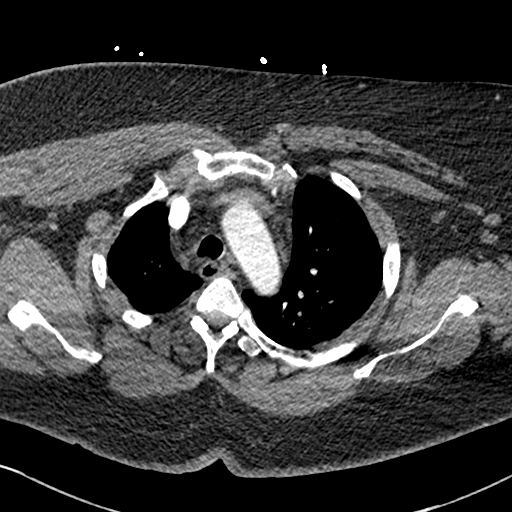
[im 282/339  lung]
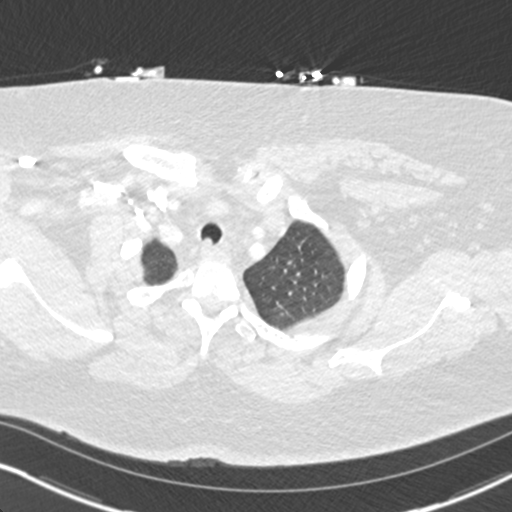
[im 296/339  soft-tissue]
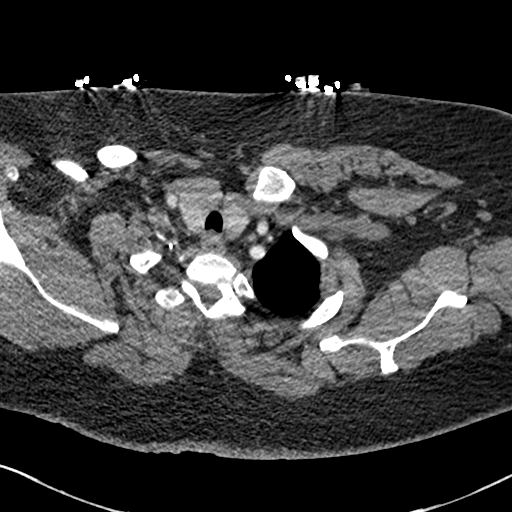
[im 324/339  lung]
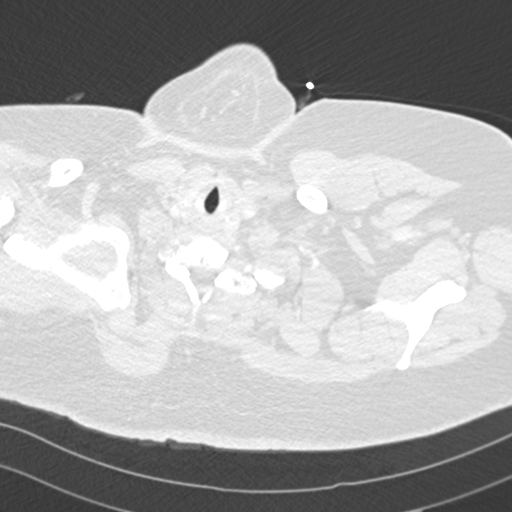

[Series 9: coronal mpr · coronal · 0.54mm/px · 3 of 100 slices shown]
[im 25/100  soft-tissue]
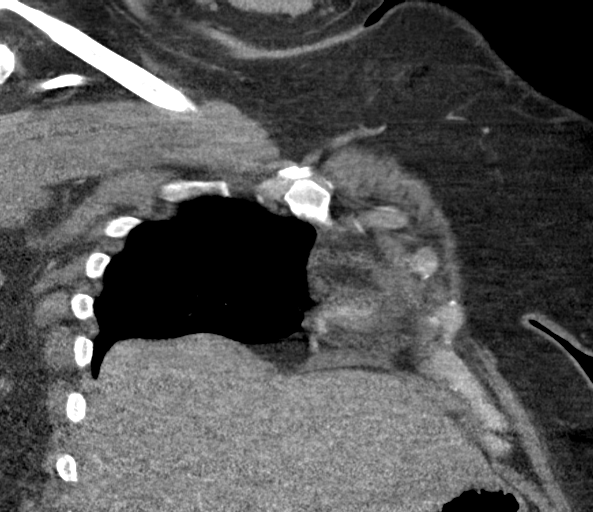
[im 50/100  soft-tissue]
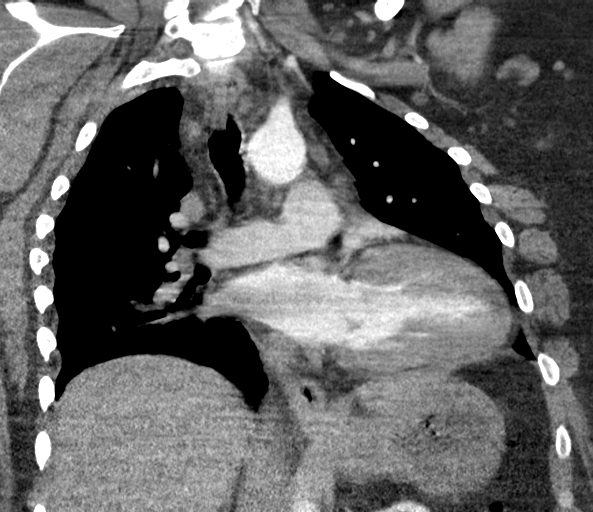
[im 75/100  soft-tissue]
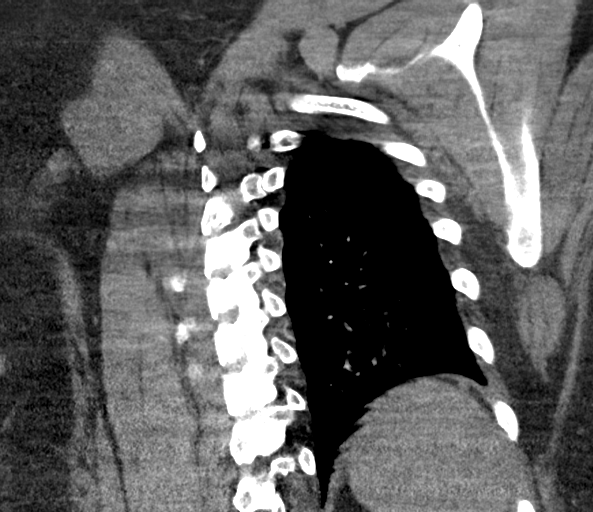

[18 of 46 positions shown; findings below may reference images not displayed]

FINDINGS: Cardiovascular: Normal heart size. No pericardial effusion.
Nondiagnostic opacification of the pulmonary arteries with no gross
filling defect. Repeat not undertaken given depressed GFR. Normal
aorta.

Mediastinum/Nodes: Prominent anterior mediastinal nodes but strictly
nonenlarged. These appear separate from the midline where no
residual thymus is seen.

Lungs/Pleura: Mild ground-glass and reticular opacity attributed to
atelectasis. There is no edema, consolidation, effusion, or
pneumothorax. Chronic calcific density at the right middle lobe
which appears to be within a pulmonary artery, unchanged; no visible
prior vertebroplasty or other cement augmentation.

Upper Abdomen: Negative

Musculoskeletal: No acute or aggressive finding

Review of the MIP images confirms the above findings.
IMPRESSION: 1. Nondiagnostic pulmonary artery angiogram which was not repeated
given patient's depressed GFR.
2. No acute finding.

## 2022-06-23 ENCOUNTER — Ambulatory Visit
Admission: EM | Admit: 2022-06-23 | Discharge: 2022-06-23 | Disposition: A | Discharge status: Home / Self Care | Payer: BC Managed Care – PPO | Attending: Emergency Medicine | Admitting: Emergency Medicine

## 2022-06-23 ENCOUNTER — Encounter: Payer: Self-pay | Admitting: Emergency Medicine

## 2022-06-23 DIAGNOSIS — J069 Acute upper respiratory infection, unspecified: Secondary | ICD-10-CM

## 2022-06-23 MED ORDER — AZITHROMYCIN 250 MG PO TABS: 250.0000 mg | ORAL_TABLET | Freq: Every day | ORAL | 0 refills | Status: DC

## 2022-06-23 NOTE — ED Triage Notes (Signed)
Pt presents with a cough, sore throat, chills, nasal congestion, headache, and fever x 1 week.

## 2022-06-23 NOTE — Discharge Instructions (Signed)
Begin azithromycin to cover for bacteria which may be aiding to your symptoms    You can take Tylenol and/or Ibuprofen as needed for fever reduction and pain relief.   For cough: honey 1/2 to 1 teaspoon (you can dilute the honey in water or another fluid).  You can also use guaifenesin and dextromethorphan for cough. You can use a humidifier for chest congestion and cough.  If you don't have a humidifier, you can sit in the bathroom with the hot shower running.      For sore throat: try warm salt water gargles, cepacol lozenges, throat spray, warm tea or water with lemon/honey, popsicles or ice, or OTC cold relief medicine for throat discomfort.   For congestion: take a daily anti-histamine like Zyrtec, Claritin, and a oral decongestant, such as pseudoephedrine.  You can also use Flonase 1-2 sprays in each nostril daily.   It is important to stay hydrated: drink plenty of fluids (water, gatorade/powerade/pedialyte, juices, or teas) to keep your throat moisturized and help further relieve irritation/discomfort.

## 2022-06-23 NOTE — ED Provider Notes (Signed)
[facility]    CSN: [id number] Arrival date & time: 06/23/22  1309      History   Chief Complaint Chief Complaint  Patient presents with   Chills   Sore Throat   Nasal Congestion   Cough   Fever    HPI Shelly Benjamin is a 52 y.o. female.   Patient presents for evaluation of fever, chills, body aches, nasal congestion, rhinorrhea, sore throat, cough and headaches present for 7 days.  Experiencing diarrhea as well with last occurrence this morning, described as soft.  Fever peaking at 101.  No known sick contact prior.  Home COVID test negative.  Decreased appetite but tolerating fluids.  Has attempted use of sinus cold and flu mix which has been a little helpful.  Denies shortness of breath and wheezing.  Past Medical History:  Diagnosis Date   Diabetes mellitus without complication ([place])    GERD (gastroesophageal reflux disease)    Hyperlipidemia    Hypertension    Pancreatitis     Patient Active Problem List   Diagnosis Date Noted   Encephalopathy due to COVID-19 virus    Pain of upper abdomen    Iron deficiency anemia    Generalized abdominal pain    Acute kidney injury superimposed on CKD (HCC)    CKD (chronic kidney disease) stage 3, GFR 30-59 ml/min ([place]) 123456   Acute metabolic encephalopathy 123456   Acute gastroenteritis 08/25/2019   Type 2 diabetes mellitus with hyperlipidemia ([place]) 08/25/2019   Essential hypertension 08/25/2019   Left-sided weakness 08/25/2019   Personal history of covid-19  08/09/19 08/25/2019   Altered mental status 08/25/2019   Morbid obesity with BMI of 50.0-59.9, adult ([place]) 08/25/2019    Past Surgical History:  Procedure Laterality Date   CHOLECYSTECTOMY     TUBAL LIGATION      OB History   No obstetric history on file.      Home Medications    Prior to Admission medications   Medication Sig Start Date End Date Taking? Authorizing Provider  amLODipine (NORVASC) 5 MG tablet Take 1 tablet by mouth  daily. 07/26/20 06/23/22 Yes [provider]  aspirin 81 MG EC tablet Take 1 tablet by mouth daily. 09/01/19 12/18/57 Yes [provider]  lisinopril (ZESTRIL) 30 MG tablet Take 30 mg by mouth daily. 09/11/20  Yes [provider]  metFORMIN (GLUCOPHAGE-XR) 500 MG 24 hr tablet Take 1,000 mg by mouth in the morning and at bedtime. 08/24/20 06/23/22 Yes [provider]  acetaminophen (TYLENOL) 325 MG tablet Take 2 tablets (650 mg total) by mouth every 6 (six) hours as needed for mild pain (or Fever >/= 101). 08/28/19   Loletha Grayer, MD  acetaminophen (TYLENOL) 500 MG tablet Take by mouth.    [provider]  amitriptyline (ELAVIL) 10 MG tablet Take 10 mg by mouth at bedtime.    [provider]  amLODipine (NORVASC) 5 MG tablet Take 1 tablet (5 mg total) by mouth daily. 05/01/20 09/21/21  Laurene Footman B, PA-C  atorvastatin (LIPITOR) 40 MG tablet Take 1 tablet by mouth at bedtime. 08/21/17   [provider]  atorvastatin (LIPITOR) 80 MG tablet Take 80 mg by mouth daily. 09/11/20   [provider]  ferrous sulfate 325 (65 FE) MG EC tablet Take 325 mg by mouth in the morning and at bedtime.    [provider]  insulin glargine (LANTUS) 100 UNIT/ML injection Inject 0.2 mLs (20 Units total) into the skin  at bedtime. Patient not taking: Reported on 12/27/2020 08/28/19   Loletha Grayer, MD  ipratropium (ATROVENT) 0.06 % nasal spray Place 2 sprays into both nostrils 4 (four) times daily. 05/01/20   Danton Clap, PA-C  Multiple Vitamins-Minerals (ALIVE WOMENS 50+) TABS Take 1 tablet by mouth daily.    [provider]  Pancrelipase, Lip-Prot-Amyl, 24000-76000 units CPEP Take 1-2 capsules by mouth with breakfast, with lunch, and with evening meal. Take 2 capsules with full meals. Take 1 capsule with half meals or snacks. Do not take if not eating. Patient not taking: Reported on 12/27/2020    [provider]  Pancrelipase,  Lip-Prot-Amyl, 24000-76000 units CPEP Take 1 capsule by mouth with snacks. Patient not taking: Reported on 12/27/2020    [provider]  pantoprazole (PROTONIX) 40 MG tablet Take 40 mg by mouth daily.    [provider]  SEMGLEE, YFGN, 100 UNIT/ML Pen Inject into the skin. 08/23/21   [provider]  spironolactone (ALDACTONE) 25 MG tablet Take 1 tablet by mouth daily. 08/24/20 08/24/21  [provider]  topiramate (TOPAMAX) 25 MG tablet Take 25 mg by mouth 2 (two) times daily. 12/19/20   [provider]    Family History Family History  Problem Relation Age of Onset   Diabetes Mother    Heart failure Mother    Kidney failure Mother    COPD Mother    Hypertension Mother    Other Father        "haw river syndrome"    Social History Social History   Tobacco Use   Smoking status: Former    Packs/day: 0.50    Types: Cigarettes    Quit date: 05/07/2018    Years since quitting: 4.1   Smokeless tobacco: Never  Vaping Use   Vaping Use: Never used  Substance Use Topics   Alcohol use: No   Drug use: Never     Allergies   Exenatide, Metformin, Morphine and related, Naproxen, Oxycodone, and Ibuprofen   Review of Systems Review of Systems  Constitutional:  Positive for chills and fever. Negative for activity change, appetite change, diaphoresis, fatigue and unexpected weight change.  HENT:  Positive for congestion, rhinorrhea and sore throat. Negative for dental problem, drooling, ear discharge, ear pain, facial swelling, hearing loss, mouth sores, nosebleeds, postnasal drip, sinus pressure, sinus pain, sneezing, tinnitus, trouble swallowing and voice change.   Respiratory:  Positive for cough. Negative for apnea, choking, chest tightness, shortness of breath, wheezing and stridor.   Musculoskeletal:  Positive for myalgias. Negative for arthralgias, back pain, gait problem, joint swelling, neck pain and neck stiffness.  Skin: Negative.    Neurological:  Positive for headaches. Negative for dizziness, tremors, seizures, syncope, facial asymmetry, speech difficulty, light-headedness and numbness.     Physical Exam Triage Vital Signs ED Triage Vitals  Enc Vitals Group     BP 06/23/22 1452 (!) 170/124     Pulse Rate 06/23/22 1452 (!) 101     Resp 06/23/22 1452 18     Temp 06/23/22 1452 98.8 F (37.1 C)     Temp Source 06/23/22 1452 Oral     SpO2 06/23/22 1452 97 %     Weight --      Height --      Head Circumference --      Peak Flow --      Pain Score 06/23/22 1450 7     Pain Loc --      Pain Edu? --  Excl. in [place]? --    No data found.  Updated Vital Signs BP (!) 170/124 (BP Location: Right Wrist)   Pulse (!) 101   Temp 98.8 F (37.1 C) (Oral)   Resp 18   LMP  (LMP Unknown) Comment: pt had a tubal ligation  SpO2 97%   Visual Acuity Right Eye Distance:   Left Eye Distance:   Bilateral Distance:    Right Eye Near:   Left Eye Near:    Bilateral Near:     Physical Exam Constitutional:      Appearance: Normal appearance.  HENT:     Head: Normocephalic.     Right Ear: Tympanic membrane, ear canal and external ear normal.     Left Ear: Tympanic membrane, ear canal and external ear normal.     Nose: Congestion and rhinorrhea present.     Mouth/Throat:     Mouth: Mucous membranes are moist.     Pharynx: Oropharynx is clear.     Tonsils: No tonsillar exudate. 2+ on the right. 2+ on the left.  Eyes:     Extraocular Movements: Extraocular movements intact.  Cardiovascular:     Rate and Rhythm: Normal rate and regular rhythm.     Pulses: Normal pulses.     Heart sounds: Normal heart sounds.  Pulmonary:     Effort: Pulmonary effort is normal.     Breath sounds: Normal breath sounds.  Skin:    General: Skin is warm and dry.  Neurological:     Mental Status: She is alert and oriented to person, place, and time. Mental status is at baseline.      UC Treatments / Results  Labs (all labs  ordered are listed, but only abnormal results are displayed) Labs Reviewed - No data to display  EKG   Radiology No results found.  Procedures Procedures (including critical care time)  Medications Ordered in UC Medications - No data to display  Initial Impression / Assessment and Plan / UC Course  I have reviewed the triage vital signs and the nursing notes.  Pertinent labs & imaging results that were available during my care of the patient were reviewed by me and considered in my medical decision making (see chart for details).  Acute upper respiratory infection  Patient is in no signs of distress nor toxic appearing.  Vital signs are stable.  Low suspicion for pneumonia, pneumothorax or bronchitis and therefore will defer imaging.  Due to timeline of illness, viral testing deferred.  Prescribed azithromycin prophylactically. May use additional over-the-counter medications as needed for supportive care.  May follow-up with urgent care as needed if symptoms persist or worsen.  Note given.   Final Clinical Impressions(s) / UC Diagnoses   Final diagnoses:  None   Discharge Instructions   None    ED Prescriptions   None    PDMP not reviewed this encounter.   Hans Eden, NP 06/23/22 1558

## 2023-04-01 ENCOUNTER — Ambulatory Visit: Payer: Medicaid Other

## 2023-04-01 ENCOUNTER — Ambulatory Visit
Admission: EM | Admit: 2023-04-01 | Discharge: 2023-04-01 | Disposition: A | Discharge status: Home / Self Care | Payer: Medicaid Other | Attending: Emergency Medicine | Admitting: Emergency Medicine

## 2023-04-01 DIAGNOSIS — J069 Acute upper respiratory infection, unspecified: Secondary | ICD-10-CM | POA: Diagnosis present

## 2023-04-01 DIAGNOSIS — M26622 Arthralgia of left temporomandibular joint: Secondary | ICD-10-CM | POA: Diagnosis not present

## 2023-04-01 LAB — GROUP A STREP BY PCR: Group A Strep by PCR: NOT DETECTED

## 2023-04-01 LAB — RESP PANEL BY RT-PCR (FLU A&B, COVID) ARPGX2
Influenza A by PCR: NEGATIVE
Influenza B by PCR: NEGATIVE
SARS Coronavirus 2 by RT PCR: NEGATIVE

## 2023-04-01 MED ORDER — ALBUTEROL SULFATE HFA 108 (90 BASE) MCG/ACT IN AERS: 1.0000 | INHALATION_SPRAY | RESPIRATORY_TRACT | 0 refills | Status: AC | PRN

## 2023-04-01 MED ORDER — PREDNISONE 20 MG PO TABS: 40.0000 mg | ORAL_TABLET | Freq: Every day | ORAL | 0 refills | Status: AC

## 2023-04-01 MED ORDER — AEROCHAMBER MV MISC: 1 refills | Status: AC

## 2023-04-01 MED ORDER — PROMETHAZINE-DM 6.25-15 MG/5ML PO SYRP: 5.0000 mL | ORAL_SOLUTION | Freq: Four times a day (QID) | ORAL | 0 refills | Status: AC | PRN

## 2023-04-01 MED ORDER — IPRATROPIUM BROMIDE 0.06 % NA SOLN: 2.0000 | Freq: Four times a day (QID) | NASAL | 0 refills | Status: DC

## 2023-04-01 NOTE — Discharge Instructions (Signed)
We we will contact you if your COVID or strep come back positive and we will prescribe the appropriate medications.  Your chest x-ray is negative for pneumonia.  Saline nasal irrigation with a NeilMed sinus rinse and distilled water as often as you want, Atrovent nasal spray, Mucinex for the nasal congestion.  Tylenol 1000 mg 3 times a day as needed for headache.  2 puffs from your albuterol inhaler using your spacer every 4 hours for 2 days, then every 6 hours for 2 days, then as needed.  You can back off on the albuterol if you start to improve sooner.  Prednisone 40 mg for 5 days and a soft diet for the next 5 days to help with your ear pain.  Follow-up with your dentist for a bite guard.

## 2023-04-01 NOTE — ED Triage Notes (Signed)
Cough, ear pain, headache, fatigue, diarrhea, SOB with exertion that started Sunday. Pt states she took a home covid test yesterday and it was negative. Taking alka selter plus cold and flu with no relief of symptoms.

## 2023-04-01 NOTE — ED Provider Notes (Signed)
HPI  SUBJECTIVE:  Shelly Benjamin is a 52 y.o. female who presents with 4 days of bodyaches, left ear pain, decreased hearing, cough, sore throat secondary to the cough.  States that she has felt feverish, but has not measured her temperature.  She reports headaches, nasal congestion, rhinorrhea, sinus pain and pressure, postnasal drip, wheezing and diarrhea.  She is unable to sleep at night because of the cough.  No facial swelling, upper dental pain, shortness of breath, dyspnea on exertion, nausea, vomiting, abdominal pain.  She had a negative home COVID test last night.  No antibiotics in the past 3 months.  No antipyretic in the past 6 hours.  She does not grind her teeth at night.  She has tried Flonase, Alka-Seltzer cold Tylenol, and Vicks without improvement in her symptoms.  No aggravating factors.  She also states that her "shingles is flaring".  She reports burning pain along her left trapezius where she has had shingles in the past for the past several weeks.  No rash.  She has been taking Neurontin for this.  Patient has a past medical history of diabetes, GERD, hyperlipidemia, hypertension, chronic pancreatitis, chronic kidney disease stage III, BMI above 30, shingles.  No history of pulmonary disease, smoking.  Past Medical History:  Diagnosis Date   Diabetes mellitus without complication (HCC)    GERD (gastroesophageal reflux disease)    Hyperlipidemia    Hypertension    Pancreatitis     Past Surgical History:  Procedure Laterality Date   CHOLECYSTECTOMY     TUBAL LIGATION      Family History  Problem Relation Age of Onset   Diabetes Mother    Heart failure Mother    Kidney failure Mother    COPD Mother    Hypertension Mother    Other Father        "haw river syndrome"    Social History   Tobacco Use   Smoking status: Former    Current packs/day: 0.00    Types: Cigarettes    Quit date: 05/07/2018    Years since quitting: 4.9   Smokeless tobacco: Never   Vaping Use   Vaping status: Never Used  Substance Use Topics   Alcohol use: No   Drug use: Never    No current facility-administered medications for this encounter.  Current Outpatient Medications:    acetaminophen (TYLENOL) 325 MG tablet, Take 2 tablets (650 mg total) by mouth every 6 (six) hours as needed for mild pain (or Fever >/= 101)., Disp:  , Rfl:    acetaminophen (TYLENOL) 500 MG tablet, Take by mouth., Disp: , Rfl:    albuterol (VENTOLIN HFA) 108 (90 Base) MCG/ACT inhaler, Inhale 1-2 puffs into the lungs every 4 (four) hours as needed for wheezing or shortness of breath., Disp: 1 each, Rfl: 0   amLODipine (NORVASC) 5 MG tablet, Take 1 tablet by mouth daily., Disp: , Rfl:    aspirin 81 MG EC tablet, Take 1 tablet by mouth daily., Disp: , Rfl:    atorvastatin (LIPITOR) 40 MG tablet, Take 1 tablet by mouth at bedtime., Disp: , Rfl:    atorvastatin (LIPITOR) 80 MG tablet, Take 80 mg by mouth daily., Disp: , Rfl:    ferrous sulfate 325 (65 FE) MG EC tablet, Take 325 mg by mouth in the morning and at bedtime., Disp: , Rfl:    insulin glargine (LANTUS) 100 UNIT/ML injection, Inject 0.2 mLs (20 Units total) into the skin at bedtime., Disp: 10 mL,  Rfl: 11   ipratropium (ATROVENT) 0.06 % nasal spray, Place 2 sprays into both nostrils 4 (four) times daily., Disp: 15 mL, Rfl: 0   lisinopril (ZESTRIL) 30 MG tablet, Take 30 mg by mouth daily., Disp: , Rfl:    pantoprazole (PROTONIX) 40 MG tablet, Take 40 mg by mouth daily., Disp: , Rfl:    predniSONE (DELTASONE) 20 MG tablet, Take 2 tablets (40 mg total) by mouth daily with breakfast for 5 days., Disp: 10 tablet, Rfl: 0   promethazine-dextromethorphan (PROMETHAZINE-DM) 6.25-15 MG/5ML syrup, Take 5 mLs by mouth 4 (four) times daily as needed for cough., Disp: 118 mL, Rfl: 0   Spacer/Aero-Holding Chambers (AEROCHAMBER MV) inhaler, Use as instructed, Disp: 1 each, Rfl: 1   amitriptyline (ELAVIL) 10 MG tablet, Take 10 mg by mouth at bedtime.,  Disp: , Rfl:    amLODipine (NORVASC) 5 MG tablet, Take 1 tablet (5 mg total) by mouth daily., Disp: 90 tablet, Rfl: 0   metFORMIN (GLUCOPHAGE-XR) 500 MG 24 hr tablet, Take 1,000 mg by mouth in the morning and at bedtime. (Patient not taking: Reported on 04/01/2023), Disp: , Rfl:    Multiple Vitamins-Minerals (ALIVE WOMENS 50+) TABS, Take 1 tablet by mouth daily., Disp: , Rfl:    Pancrelipase, Lip-Prot-Amyl, 24000-76000 units CPEP, Take 1-2 capsules by mouth with breakfast, with lunch, and with evening meal. Take 2 capsules with full meals. Take 1 capsule with half meals or snacks. Do not take if not eating. (Patient not taking: Reported on 12/27/2020), Disp: , Rfl:    Pancrelipase, Lip-Prot-Amyl, 24000-76000 units CPEP, Take 1 capsule by mouth with snacks. (Patient not taking: Reported on 12/27/2020), Disp: , Rfl:    SEMGLEE, YFGN, 100 UNIT/ML Pen, Inject into the skin. (Patient not taking: Reported on 04/01/2023), Disp: , Rfl:    spironolactone (ALDACTONE) 25 MG tablet, Take 1 tablet by mouth daily. (Patient not taking: Reported on 04/01/2023), Disp: , Rfl:    topiramate (TOPAMAX) 25 MG tablet, Take 25 mg by mouth 2 (two) times daily. (Patient not taking: Reported on 04/01/2023), Disp: , Rfl:   Allergies  Allergen Reactions   Exenatide Other (See Comments)    Pt doesn't know   Metformin Diarrhea    500mg  by mouth twice a day   Morphine And Codeine Itching   Naproxen Swelling   Oxycodone     Lip swelling   Ibuprofen Nausea And Vomiting    GI upset     ROS  As noted in HPI.   Physical Exam  BP 135/86 (BP Location: Left Arm)   Pulse 96   Temp 98.7 F (37.1 C) (Oral)   Resp 20   LMP  (LMP Unknown) Comment: pt had a tubal ligation  SpO2 97%   Constitutional: Well developed, well nourished, no acute distress Eyes:  EOMI, conjunctiva normal bilaterally HENT: Normocephalic, atraumatic,mucus membranes moist.  Bilateral ears: External ear, EAC, TM normal.  No pain with traction on pinna,  palpation of  mastoid.  Tenderness over the left TMJ, tragus.  No crepitus.  Erythematous, swollen turbinates.  Clear nasal mucosa.  Positive frontal sinus tenderness.  No maxillary sinus tenderness.  Erythematous, enlarged tonsils without exudates. Neck: Positive cervical lymphadenopathy Respiratory: Normal inspiratory effort, lungs clear bilaterally, good air movement. Cardiovascular: Normal rate, regular rhythm, no murmurs rubs or gallops. GI: nondistended skin: No rash over left trapezius, skin intact Musculoskeletal: no deformities Neurologic: Alert & oriented x 3, no focal neuro deficits Psychiatric: Speech and behavior appropriate   ED Course  Medications - No data to display  Orders Placed This Encounter  Procedures   Resp Panel by RT-PCR (Flu A&B, Covid) Anterior Nasal Swab    Standing Status:   Standing    Number of Occurrences:   1   Group A Strep by PCR    Standing Status:   Standing    Number of Occurrences:   1   DG Chest 2 View    Standing Status:   Standing    Number of Occurrences:   1    Order Specific Question:   Reason for Exam (SYMPTOM  OR DIAGNOSIS REQUIRED)    Answer:   cough 4 days r/o PNA    No results found for this or any previous visit (from the past 24 hour(s)). DG Chest 2 View  Result Date: 04/01/2023 CLINICAL DATA:  Cough. EXAM: CHEST - 2 VIEW COMPARISON:  12/27/2020. FINDINGS: Cardiac silhouette normal in size. Normal mediastinal and hilar contours. Clear lungs.  No pleural effusion or pneumothorax. Skeletal structures are intact. IMPRESSION: No active cardiopulmonary disease. Electronically Signed   By: Amie Portland M.D.   On: 04/01/2023 11:46    ED Clinical Impression  1. Viral URI with cough   2. Arthralgia of left temporomandibular joint      ED Assessment/Plan     Outside labs reviewed.  Calculated creatinine clearance from labs done earlier last month 40 mL/min.  1.  Cough, nasal congestion, sinus pain and pressure.   Presentation consistent with an upper respiratory infection.  Will check COVID, flu, strep, chest x-ray.  She is unfortunately out of the treatment window for influenza, however if COVID is positive, will prescribe molnupiravir.  Reviewed imaging independently.  No acute cardiopulmonary disease see radiology report for full details.    She does not have any evidence of bacterial sinusitis and does not meet criteria for antibiotics for sinusitis today.  COVID, strep, flu negative.  Atrovent nasal spray, Promethazine DM, saline nasal irrigation, Mucinex.  Regularly scheduled albuterol inhaler with a spacer for 4 days, then as needed thereafter.  Tylenol 1000 mg 3 times a day.  Follow-up with PCP if not better in several days.  ER return precautions given.  Work note for 2 days  2.  Left otalgia.  No evidence of otitis media, externa.  She is tender over her TMJ.  Patient has TMJ arthralgia.  Home with prednisone 40 mg for 5 days, she states that she can adjust her insulin accordingly.  Soft diet.  No NSAIDs due to chronic kidney disease.  Follow-up with dentist for a bite guard.  3.  Pain left trapezius/possible shingles flare.  No rash today.  This has been going on for several weeks.  I would think that a shingles rash would have shown up by now.  She is to continue Neurontin as prescribed.  Follow-up with PCP.  Discussed labs, imaging, MDM, treatment plan, and plan for follow-up with patient. Discussed sn/sx that should prompt return to the ED. patient agrees with plan.   Meds ordered this encounter  Medications   predniSONE (DELTASONE) 20 MG tablet    Sig: Take 2 tablets (40 mg total) by mouth daily with breakfast for 5 days.    Dispense:  10 tablet    Refill:  0   ipratropium (ATROVENT) 0.06 % nasal spray    Sig: Place 2 sprays into both nostrils 4 (four) times daily.    Dispense:  15 mL    Refill:  0  promethazine-dextromethorphan (PROMETHAZINE-DM) 6.25-15 MG/5ML syrup    Sig: Take 5  mLs by mouth 4 (four) times daily as needed for cough.    Dispense:  118 mL    Refill:  0   albuterol (VENTOLIN HFA) 108 (90 Base) MCG/ACT inhaler    Sig: Inhale 1-2 puffs into the lungs every 4 (four) hours as needed for wheezing or shortness of breath.    Dispense:  1 each    Refill:  0   Spacer/Aero-Holding Chambers (AEROCHAMBER MV) inhaler    Sig: Use as instructed    Dispense:  1 each    Refill:  1      *This clinic note was created using Dragon dictation software. Therefore, there may be occasional mistakes despite careful proofreading.  ?    Domenick Gong, MD 04/01/23 [phone]

## 2024-05-06 ENCOUNTER — Ambulatory Visit
Admission: EM | Admit: 2024-05-06 | Discharge: 2024-05-06 | Disposition: A | Discharge status: Home / Self Care | Attending: Family Medicine | Admitting: Family Medicine

## 2024-05-06 ENCOUNTER — Encounter: Payer: Self-pay | Admitting: Family Medicine

## 2024-05-06 DIAGNOSIS — H1033 Unspecified acute conjunctivitis, bilateral: Secondary | ICD-10-CM

## 2024-05-06 DIAGNOSIS — J111 Influenza due to unidentified influenza virus with other respiratory manifestations: Secondary | ICD-10-CM

## 2024-05-06 MED ORDER — ONDANSETRON HCL 4 MG PO TABS: 4.0000 mg | ORAL_TABLET | Freq: Four times a day (QID) | ORAL | 0 refills | Status: AC

## 2024-05-06 MED ORDER — ERYTHROMYCIN 5 MG/GM OP OINT: TOPICAL_OINTMENT | OPHTHALMIC | 0 refills | Status: AC

## 2024-05-06 MED ORDER — IPRATROPIUM BROMIDE 0.06 % NA SOLN: 2.0000 | Freq: Four times a day (QID) | NASAL | 0 refills | Status: AC

## 2024-05-06 NOTE — ED Provider Notes (Signed)
 " [facility]    CSN: [id number] Arrival date & time: 05/06/24  9173      History   Chief Complaint Chief Complaint  Patient presents with   Cough   Nasal Congestion    HPI Shelly Benjamin is a 54 y.o. female.   HPI  History obtained from the patient. Shelly Benjamin presents for flu-like symptoms for the past 5 days.  Has body aches, runny nose, cough, headache, diarrhea, eye discomfort and vomiting.  The vomiting has stopped and the diarrhea has slowed.  She has been tired. Notes chills and hot flashes.  No documented fever. Tried Tylenol  but nothing else. A lady was sick at work.        Past Medical History:  Diagnosis Date   Diabetes mellitus without complication (HCC)    GERD (gastroesophageal reflux disease)    Hyperlipidemia    Hypertension    Pancreatitis     Patient Active Problem List   Diagnosis Date Noted   Encephalopathy due to COVID-19 virus    Pain of upper abdomen    Iron deficiency anemia    Generalized abdominal pain    Acute kidney injury superimposed on CKD    CKD (chronic kidney disease) stage 3, GFR 30-59 ml/min (HCC) 08/25/2019   Acute metabolic encephalopathy 08/25/2019   Acute gastroenteritis 08/25/2019   Type 2 diabetes mellitus with hyperlipidemia (HCC) 08/25/2019   Essential hypertension 08/25/2019   Left-sided weakness 08/25/2019   Personal history of covid-19  08/09/19 08/25/2019   Altered mental status 08/25/2019   Morbid obesity with BMI of 50.0-59.9, adult (HCC) 08/25/2019    Past Surgical History:  Procedure Laterality Date   CHOLECYSTECTOMY     TUBAL LIGATION      OB History   No obstetric history on file.      Home Medications    Prior to Admission medications  Medication Sig Start Date End Date Taking? Authorizing Provider  acetaminophen  (TYLENOL ) 325 MG tablet Take 2 tablets (650 mg total) by mouth every 6 (six) hours as needed for mild pain (or Fever >/= 101). 08/28/19  Yes Wieting, Richard, MD   acetaminophen  (TYLENOL ) 500 MG tablet Take by mouth.   Yes [provider]  amitriptyline  (ELAVIL ) 10 MG tablet Take 10 mg by mouth at bedtime.   Yes [provider]  aspirin 81 MG EC tablet Take 1 tablet by mouth daily. 09/01/19 12/18/57 Yes [provider]  atorvastatin  (LIPITOR) 40 MG tablet Take 1 tablet by mouth at bedtime. 08/21/17  Yes [provider]  atorvastatin  (LIPITOR) 80 MG tablet Take 80 mg by mouth daily. 09/11/20  Yes [provider]  erythromycin  ophthalmic ointment Place a 1/2 inch ribbon of ointment into the lower eyelid use 3 times a day for 7 days 05/06/24  Yes Bennet Kujawa, DO  insulin  glargine (LANTUS ) 100 UNIT/ML injection Inject 0.2 mLs (20 Units total) into the skin at bedtime. 08/28/19  Yes Wieting, Richard, MD  ipratropium (ATROVENT ) 0.06 % nasal spray Place 2 sprays into both nostrils 4 (four) times daily. 05/06/24  Yes Lida Berkery, DO  Multiple Vitamins-Minerals (ALIVE WOMENS 50+) TABS Take 1 tablet by mouth daily.   Yes [provider]  ondansetron  (ZOFRAN ) 4 MG tablet Take 1 tablet (4 mg total) by mouth every 6 (six) hours. 05/06/24  Yes Tamica Covell, DO  pantoprazole  (PROTONIX ) 40 MG tablet Take 40 mg by mouth daily.   Yes [provider]  albuterol  (VENTOLIN  HFA) 108 (90 Base)  MCG/ACT inhaler Inhale 1-2 puffs into the lungs every 4 (four) hours as needed for wheezing or shortness of breath. 04/01/23   Van Knee, MD  amLODipine  (NORVASC ) 5 MG tablet Take 1 tablet (5 mg total) by mouth daily. 05/01/20 09/21/21  Arvis Huxley B, PA-C  amLODipine  (NORVASC ) 5 MG tablet Take 1 tablet by mouth daily. 07/26/20 04/01/23  [provider]  ferrous sulfate  325 (65 FE) MG EC tablet Take 325 mg by mouth in the morning and at bedtime.    [provider]  lisinopril (ZESTRIL) 30 MG tablet Take 30 mg by mouth daily. 09/11/20   [provider]  metFORMIN (GLUCOPHAGE-XR) 500 MG 24 hr tablet Take  1,000 mg by mouth in the morning and at bedtime. Patient not taking: Reported on 04/01/2023 08/24/20 06/23/22  [provider]  Pancrelipase , Lip-Prot-Amyl, 24000-76000 units CPEP Take 1-2 capsules by mouth with breakfast, with lunch, and with evening meal. Take 2 capsules with full meals. Take 1 capsule with half meals or snacks. Do not take if not eating. Patient not taking: Reported on 12/27/2020    [provider]  Pancrelipase , Lip-Prot-Amyl, 24000-76000 units CPEP Take 1 capsule by mouth with snacks. Patient not taking: Reported on 12/27/2020    [provider]  promethazine -dextromethorphan (PROMETHAZINE -DM) 6.25-15 MG/5ML syrup Take 5 mLs by mouth 4 (four) times daily as needed for cough. 04/01/23   Van Knee, MD  SEMGLEE , YFGN, 100 UNIT/ML Pen Inject into the skin. Patient not taking: Reported on 04/01/2023 08/23/21   [provider]  Spacer/Aero-Holding Chambers (AEROCHAMBER MV) inhaler Use as instructed 04/01/23   Van Knee, MD  spironolactone (ALDACTONE) 25 MG tablet Take 1 tablet by mouth daily. Patient not taking: Reported on 04/01/2023 08/24/20 08/24/21  [provider]  topiramate (TOPAMAX) 25 MG tablet Take 25 mg by mouth 2 (two) times daily. Patient not taking: Reported on 04/01/2023 12/19/20   [provider]    Family History Family History  Problem Relation Age of Onset   Diabetes Mother    Heart failure Mother    Kidney failure Mother    COPD Mother    Hypertension Mother    Other Father        haw river syndrome    Social History Social History[1]   Allergies   Exenatide, Metformin, Morphine and codeine, Naproxen, Oxycodone , and Ibuprofen   Review of Systems Review of Systems: negative unless otherwise stated in HPI.      Physical Exam Triage Vital Signs ED Triage Vitals  Encounter Vitals Group     BP      Girls Systolic BP Percentile      Girls Diastolic BP Percentile      Boys Systolic BP  Percentile      Boys Diastolic BP Percentile      Pulse      Resp      Temp      Temp src      SpO2      Weight      Height      Head Circumference      Peak Flow      Pain Score      Pain Loc      Pain Education      Exclude from Growth Chart    No data found.  Updated Vital Signs BP (!) (P) 144/88 (BP Location: Left Arm)   Pulse 76   Temp 98 F (36.7 C) (Oral)   Resp 16  Wt (!) 145.6 kg   LMP  (LMP Unknown) Comment: pt had a tubal ligation  SpO2 98%   BMI 51.81 kg/m   Visual Acuity Right Eye Distance:   Left Eye Distance:   Bilateral Distance:    Right Eye Near:   Left Eye Near:    Bilateral Near:     Physical Exam GEN:     alert, non-toxic appearing female in no distress    HENT:  mucus membranes moist, oropharyngeal without lesions or erythema, no tonsillar hypertrophy or exudates, clear nasal discharge EYES:   pupils equal and reactive, no scleral injection or discharge NECK:  normal ROM, no lymphadenopathy, no meningismus   RESP:  no increased work of breathing, clear to auscultation bilaterally CVS:   regular rate and rhythm ABD:     Soft, nontender, nondistended Skin:   warm and dry, no rash on visible skin     UC Treatments / Results  Labs (all labs ordered are listed, but only abnormal results are displayed) Labs Reviewed - No data to display  EKG   Radiology No results found.   Procedures Procedures (including critical care time)  Medications Ordered in UC Medications - No data to display  Initial Impression / Assessment and Plan / UC Course  I have reviewed the triage vital signs and the nursing notes.  Pertinent labs & imaging results that were available during my care of the patient were reviewed by me and considered in my medical decision making (see chart for details).       Pt is a 54 y.o. female who presents for 5 days of GI and respiratory symptoms. Shelly Benjamin is afebrile here without recent antipyretics. Satting well on  room air. Overall pt is non-toxic appearing, well hydrated, without respiratory distress. Exam is unremarkable.  POC COVID and influenza panel deferred due to duration of symptoms.  Suspect viral respiratory illness likely influenza. Discussed symptomatic treatment.  Zofran  for nausea and vomiting.  Atrovent  nasal spray for nasal congestion.  Explained lack of efficacy of antibiotics in viral disease.  Typical duration of symptoms discussed.  Work note provided.  Has bilateral evidence of conjunctivitis.  Treat with erythromycin  ointment.  Return and ED precautions given and voiced understanding. Discussed MDM, treatment plan and plan for follow-up with patient who agrees with plan.     Final Clinical Impressions(s) / UC Diagnoses   Final diagnoses:  Acute conjunctivitis of both eyes, unspecified acute conjunctivitis type  Influenza-like illness     Discharge Instructions      See handout on food choices to help with diarrhea.  I suspect you have the flu but are outside the window for effective treatment with an antiviral medication.  Your symptoms will gradually improve over the next 3-5 days.  Your cough may last up to 3 weeks after a viral illness.    Be sure to monitor your blood pressure at home. Follow up with your primary care provider, if you remain elevated >135 / >85.       ED Prescriptions     Medication Sig Dispense Auth. Provider   ondansetron  (ZOFRAN ) 4 MG tablet Take 1 tablet (4 mg total) by mouth every 6 (six) hours. 20 tablet Takela Varden, DO   erythromycin  ophthalmic ointment Place a 1/2 inch ribbon of ointment into the lower eyelid use 3 times a day for 7 days 3.5 g Krishawna Stiefel, DO   ipratropium (ATROVENT ) 0.06 % nasal spray Place 2 sprays into both nostrils 4 (four)  times daily. 15 mL Dreyson Mishkin, DO      PDMP not reviewed this encounter.     [1]  Social History Tobacco Use   Smoking status: Former    Current packs/day: 0.00    Average  packs/day: 0.5 packs/day    Types: Cigarettes    Quit date: 05/07/2018    Years since quitting: 6.0   Smokeless tobacco: Never  Vaping Use   Vaping status: Never Used  Substance Use Topics   Alcohol use: No   Drug use: Never     Kriste Berth, DO 05/06/24 0948  "

## 2024-05-06 NOTE — ED Triage Notes (Signed)
 Pt c/o cough, congestion, diarrhea, body aches, eye drainage x5days

## 2024-05-06 NOTE — Discharge Instructions (Addendum)
 See handout on food choices to help with diarrhea.  I suspect you have the flu but are outside the window for effective treatment with an antiviral medication.  Your symptoms will gradually improve over the next 3-5 days.  Your cough may last up to 3 weeks after a viral illness.    Be sure to monitor your blood pressure at home. Follow up with your primary care provider, if you remain elevated >135 / >85.
